# Patient Record
Sex: Female | Born: 1975 | Race: White | Hispanic: No | Smoking: Never smoker
Health system: Southern US, Community
[De-identification: ages and names within clinical notes are randomized; demographics above are authoritative.]

## PROBLEM LIST (undated history)

## (undated) DIAGNOSIS — F419 Anxiety disorder, unspecified: Secondary | ICD-10-CM

## (undated) HISTORY — PX: APPENDECTOMY: SHX54

## (undated) HISTORY — DX: Anxiety disorder, unspecified: F41.9

## (undated) HISTORY — PX: INTRAUTERINE DEVICE (IUD) INSERTION: SHX5877

---

## 2001-04-16 ENCOUNTER — Other Ambulatory Visit: Admission: RE | Admit: 2001-04-16 | Discharge: 2001-04-16 | Payer: Self-pay | Admitting: Obstetrics and Gynecology

## 2005-08-11 ENCOUNTER — Encounter: Admission: RE | Admit: 2005-08-11 | Discharge: 2005-08-11 | Payer: Self-pay | Admitting: Obstetrics & Gynecology

## 2005-10-27 ENCOUNTER — Inpatient Hospital Stay (HOSPITAL_COMMUNITY): Admission: AD | Admit: 2005-10-27 | Discharge: 2005-10-30 | Payer: Self-pay | Admitting: Obstetrics & Gynecology

## 2007-05-10 ENCOUNTER — Inpatient Hospital Stay (HOSPITAL_COMMUNITY): Admission: AD | Admit: 2007-05-10 | Discharge: 2007-05-10 | Payer: Self-pay | Admitting: Obstetrics

## 2007-07-09 ENCOUNTER — Inpatient Hospital Stay (HOSPITAL_COMMUNITY): Admission: AD | Admit: 2007-07-09 | Discharge: 2007-07-10 | Payer: Self-pay | Admitting: Obstetrics and Gynecology

## 2008-12-29 ENCOUNTER — Emergency Department (HOSPITAL_COMMUNITY): Admission: EM | Admit: 2008-12-29 | Discharge: 2008-12-29 | Payer: Self-pay | Admitting: Emergency Medicine

## 2009-10-04 ENCOUNTER — Ambulatory Visit: Payer: Self-pay | Admitting: Internal Medicine

## 2010-03-29 NOTE — Assessment & Plan Note (Signed)
Summary: NEW / [employer] / OK DR PLOT/CD   Vital Signs:  Patient profile:   35 year old female Height:      67 inches Weight:      146 pounds BMI:     22.95 O2 Sat:      98 % on Room air Temp:     98.3 degrees F oral Pulse rate:   90 / minute Pulse rhythm:   regular Resp:     16 per minute BP sitting:   100 / 60  (left arm) Cuff size:   regular  Vitals Entered By: Lanier Prude, CMA(AAMA) (October 04, 2009 9:35 AM)  O2 Flow:  Room air CC: est PCP Is Patient Diabetic? No Comments pt states she is currently [redacted] wks pregnant   CC:  est PCP.  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 [place] Married 2 dtrs 4 and 2; now pregnant Never Smoked Alcohol use-yes Regular exercise-no Smoking Status:  never Does Patient Exercise:  no  Review of Systems  The patient denies anorexia, fever, weight loss, weight gain, vision loss, decreased hearing, hoarseness, chest pain, syncope, dyspnea on exertion, peripheral edema, prolonged cough, headaches, hemoptysis, abdominal pain, melena, hematochezia, severe indigestion/heartburn, hematuria, incontinence, genital sores, muscle weakness, suspicious skin lesions, transient blindness, difficulty walking, depression, unusual weight change, abnormal bleeding, enlarged lymph nodes, angioedema, and breast masses.         Pregnant now  Physical Exam  General:  Well-developed,well-nourished,in no acute distress; alert,appropriate and cooperative throughout  examination Head:  Normocephalic and atraumatic without obvious abnormalities. No apparent alopecia or balding. Eyes:  No corneal or conjunctival inflammation noted. EOMI. Perrla. Ears:  External ear exam shows no significant lesions or deformities.  Otoscopic examination reveals clear canals, tympanic membranes are intact bilaterally without bulging, retraction, inflammation or discharge. Hearing is grossly normal bilaterally. Nose:  External nasal examination shows no deformity or inflammation. Nasal mucosa are pink and moist without lesions or exudates. Mouth:  Oral mucosa and oropharynx without lesions or exudates.  Teeth in good repair. Neck:  No deformities, masses, or tenderness noted. Chest Wall:  No deformities, masses, or tenderness noted. Lungs:  Normal respiratory effort, chest expands symmetrically. Lungs are clear to auscultation, no crackles or wheezes. Heart:  Normal rate and regular rhythm. S1 and S2 normal without gallop, murmur, click, rub or other extra sounds. Abdomen:  Bowel sounds positive,abdomen soft and non-tender without masses, organomegaly or hernias noted. Msk:  No deformity or scoliosis noted of thoracic or lumbar spine.   Pulses:  R and L carotid,radial,femoral,dorsalis pedis and posterior tibial pulses are full and equal bilaterally Neurologic:  No cranial nerve deficits noted. Station and gait are normal. Plantar reflexes are down-going bilaterally. DTRs are symmetrical throughout. Sensory, motor and coordinative functions appear intact. Skin:  Intact without suspicious lesions or rashes Cervical Nodes:  No lymphadenopathy noted Psych:  Cognition and judgment appear intact. Alert and cooperative with normal attention span and concentration. No apparent delusions, illusions, hallucinations   Impression & Recommendations:  Problem # 1:  Preventive Health Care (ICD-V70.0) Assessment New Labs ordered Health and age related issues were discussed. Available  screening tests and vaccinations were discussed as well. Healthy life style including good diet and execise was discussed.  GYN f/u  is pending   Complete Medication List: 1)  Prenatal/folic Acid Tabs (Prenatal vit-fe fumarate-fa)  Patient Instructions: 1)  Please schedule a follow-up appointment in 1 year.

## 2010-05-29 ENCOUNTER — Inpatient Hospital Stay (HOSPITAL_COMMUNITY)
Admission: AD | Admit: 2010-05-29 | Discharge: 2010-05-29 | Discharge status: Against Medical Advice | Payer: BC Managed Care – PPO | Source: Ambulatory Visit | Attending: Obstetrics | Admitting: Obstetrics

## 2010-05-29 ENCOUNTER — Inpatient Hospital Stay (HOSPITAL_COMMUNITY)
Admission: AD | Admit: 2010-05-29 | Discharge: 2010-05-31 | DRG: 373 | Disposition: A | Discharge status: Home / Self Care | Payer: BC Managed Care – PPO | Source: Ambulatory Visit | Attending: Obstetrics | Admitting: Obstetrics

## 2010-05-29 DIAGNOSIS — O9903 Anemia complicating the puerperium: Secondary | ICD-10-CM | POA: Diagnosis not present

## 2010-05-29 DIAGNOSIS — D62 Acute posthemorrhagic anemia: Secondary | ICD-10-CM | POA: Diagnosis not present

## 2010-05-29 DIAGNOSIS — O4290 Premature rupture of membranes, unspecified as to length of time between rupture and onset of labor, unspecified weeks of gestation: Secondary | ICD-10-CM | POA: Insufficient documentation

## 2010-05-29 LAB — CBC
Hemoglobin: 10.5 g/dL — ABNORMAL LOW (ref 12.0–15.0)
MCH: 24.5 pg — ABNORMAL LOW (ref 26.0–34.0)
MCHC: 31.5 g/dL (ref 30.0–36.0)
MCV: 77.8 fL — ABNORMAL LOW (ref 78.0–100.0)
Platelets: 244 10*3/uL (ref 150–400)

## 2010-05-30 LAB — CBC
Hemoglobin: 9.8 g/dL — ABNORMAL LOW (ref 12.0–15.0)
MCH: 24 pg — ABNORMAL LOW (ref 26.0–34.0)
MCHC: 31.2 g/dL (ref 30.0–36.0)
MCV: 77 fL — ABNORMAL LOW (ref 78.0–100.0)
RDW: 15.8 % — ABNORMAL HIGH (ref 11.5–15.5)
WBC: 15.4 10*3/uL — ABNORMAL HIGH (ref 4.0–10.5)

## 2010-05-30 LAB — RPR: RPR Ser Ql: NONREACTIVE

## 2010-11-21 LAB — CBC
MCHC: 34.5
MCV: 81.7
WBC: 6.8

## 2010-11-21 LAB — URINALYSIS, ROUTINE W REFLEX MICROSCOPIC
Hgb urine dipstick: NEGATIVE
Nitrite: NEGATIVE
pH: 5.5

## 2010-11-21 LAB — MAGNESIUM: Magnesium: 1.7

## 2010-11-21 LAB — BASIC METABOLIC PANEL
BUN: 3 — ABNORMAL LOW
Chloride: 106
GFR calc Af Amer: >60

## 2010-11-21 LAB — HEPATIC FUNCTION PANEL
AST: 26
Albumin: 2.2 — ABNORMAL LOW
Alkaline Phosphatase: 65

## 2012-03-20 ENCOUNTER — Encounter: Payer: Self-pay | Admitting: Internal Medicine

## 2012-03-20 ENCOUNTER — Ambulatory Visit (INDEPENDENT_AMBULATORY_CARE_PROVIDER_SITE_OTHER): Payer: BC Managed Care – PPO | Admitting: Internal Medicine

## 2012-03-20 VITALS — BP 118/70 | HR 80 | Temp 98.2°F | Resp 16 | Ht 67.0 in | Wt 141.0 lb

## 2012-03-20 DIAGNOSIS — Z0000 Encounter for general adult medical examination without abnormal findings: Secondary | ICD-10-CM

## 2012-03-20 DIAGNOSIS — Z23 Encounter for immunization: Secondary | ICD-10-CM

## 2012-03-20 MED ORDER — VITAMIN D 1000 UNITS PO TABS: 1000.0000 [IU] | ORAL_TABLET | Freq: Every day | ORAL | Status: AC

## 2012-03-20 NOTE — Assessment & Plan Note (Signed)
We discussed age appropriate health related issues, including available/recomended screening tests and vaccinations. We discussed a need for adhering to healthy diet and exercise. Labs/EKG were reviewed/ordered. All questions were answered.  Tdap GYN/Ophth q 12 mo

## 2012-03-20 NOTE — Progress Notes (Signed)
  Subjective:    Patient ID: Heather Patterson, female    DOB: November 18, 1975, 37 y.o.   MRN: [id number]  HPI  The patient is here for a wellness exam. The patient has been doing well overall without major physical or psychological issues going on lately.  Review of Systems  Constitutional: Negative for chills, activity change, appetite change, fatigue and unexpected weight change.  HENT: Negative for congestion, mouth sores and sinus pressure.   Eyes: Negative for visual disturbance.  Respiratory: Negative for cough and chest tightness.   Gastrointestinal: Negative for nausea and abdominal pain.  Genitourinary: Negative for frequency, difficulty urinating and vaginal pain.  Musculoskeletal: Negative for back pain and gait problem.  Skin: Negative for pallor and rash.  Neurological: Negative for dizziness, tremors, weakness, numbness and headaches.  Psychiatric/Behavioral: Negative for suicidal ideas, confusion and sleep disturbance.       Objective:   Physical Exam  Constitutional: She appears well-developed. No distress.  HENT:  Head: Normocephalic.  Right Ear: External ear normal.  Left Ear: External ear normal.  Nose: Nose normal.  Mouth/Throat: Oropharynx is clear and moist.  Eyes: Conjunctivae normal are normal. Pupils are equal, round, and reactive to light. Right eye exhibits no discharge. Left eye exhibits no discharge.  Neck: Normal range of motion. Neck supple. No JVD present. No tracheal deviation present. No thyromegaly present.  Cardiovascular: Normal rate, regular rhythm and normal heart sounds.   Pulmonary/Chest: No stridor. No respiratory distress. She has no wheezes.  Abdominal: Soft. Bowel sounds are normal. She exhibits no distension and no mass. There is no tenderness. There is no rebound and no guarding.  Musculoskeletal: She exhibits no edema and no tenderness.  Lymphadenopathy:    She has no cervical adenopathy.  Neurological: She displays normal reflexes. No  cranial nerve deficit. She exhibits normal muscle tone. Coordination normal.  Skin: No rash noted. No erythema.  Psychiatric: She has a normal mood and affect. Her behavior is normal. Judgment and thought content normal.          Assessment & Plan:

## 2012-03-29 ENCOUNTER — Other Ambulatory Visit (INDEPENDENT_AMBULATORY_CARE_PROVIDER_SITE_OTHER): Payer: BC Managed Care – PPO

## 2012-03-29 DIAGNOSIS — Z0000 Encounter for general adult medical examination without abnormal findings: Secondary | ICD-10-CM

## 2012-03-29 LAB — LIPID PANEL
Cholesterol: 124 mg/dL (ref 0–200)
HDL: 40 mg/dL (ref >39.00)
LDL Cholesterol: 73 mg/dL (ref 0–99)

## 2012-03-29 LAB — HEPATIC FUNCTION PANEL
ALT: 13 U/L (ref 0–35)
AST: 15 U/L (ref 0–37)
Albumin: 3.9 g/dL (ref 3.5–5.2)
Alkaline Phosphatase: 27 U/L — ABNORMAL LOW (ref 39–117)
Bilirubin, Direct: 0 mg/dL (ref 0.0–0.3)
Total Bilirubin: 0.3 mg/dL (ref 0.3–1.2)
Total Protein: 7.3 g/dL (ref 6.0–8.3)

## 2012-03-29 LAB — BASIC METABOLIC PANEL
CO2: 25 mEq/L (ref 19–32)
Chloride: 107 mEq/L (ref 96–112)
GFR: 75.04 mL/min (ref >60.00)
Glucose, Bld: 101 mg/dL — ABNORMAL HIGH (ref 70–99)
Potassium: 4 mEq/L (ref 3.5–5.1)
Sodium: 138 mEq/L (ref 135–145)

## 2012-03-29 LAB — CBC WITH DIFFERENTIAL/PLATELET
Eosinophils Relative: 2.5 % (ref 0.0–5.0)
Hemoglobin: 9.1 g/dL — ABNORMAL LOW (ref 12.0–15.0)
Lymphocytes Relative: 34.1 % (ref 12.0–46.0)
Lymphs Abs: 2 10*3/uL (ref 0.7–4.0)
MCV: 63.4 fl — ABNORMAL LOW (ref 78.0–100.0)
Monocytes Relative: 8.8 % (ref 3.0–12.0)
Neutro Abs: 3.1 10*3/uL (ref 1.4–7.7)
Platelets: 300 10*3/uL (ref 150.0–400.0)
RBC: 4.59 Mil/uL (ref 3.87–5.11)
WBC: 5.7 10*3/uL (ref 4.5–10.5)

## 2012-03-29 LAB — URINALYSIS
Bilirubin Urine: NEGATIVE
Hgb urine dipstick: NEGATIVE
Leukocytes, UA: NEGATIVE
Total Protein, Urine: NEGATIVE
pH: 6 (ref 5.0–8.0)

## 2012-03-30 ENCOUNTER — Other Ambulatory Visit: Payer: Self-pay | Admitting: Internal Medicine

## 2012-03-30 NOTE — Telephone Encounter (Signed)
Heather Patterson, please, inform patient that all labs are normal except for anemia - likely iron deficiency. Pls start Niferex. Labs in 3 mo Pls sign up yourself and husband for mychart CBC, iron in 3 mo Thx

## 2012-04-01 MED ORDER — IRON POLYSACCH CMPLX-B12-FA 150-0.025-1 MG PO CAPS: 1.0000 | ORAL_CAPSULE | Freq: Every day | ORAL | Status: DC

## 2012-04-01 NOTE — Telephone Encounter (Signed)
Pt informed

## 2012-05-28 ENCOUNTER — Ambulatory Visit (INDEPENDENT_AMBULATORY_CARE_PROVIDER_SITE_OTHER)
Admission: RE | Admit: 2012-05-28 | Discharge: 2012-05-28 | Disposition: A | Discharge status: Home / Self Care | Payer: BC Managed Care – PPO | Source: Ambulatory Visit | Attending: Internal Medicine | Admitting: Internal Medicine

## 2012-05-28 ENCOUNTER — Ambulatory Visit (INDEPENDENT_AMBULATORY_CARE_PROVIDER_SITE_OTHER): Payer: BC Managed Care – PPO | Admitting: Internal Medicine

## 2012-05-28 ENCOUNTER — Encounter: Payer: Self-pay | Admitting: Internal Medicine

## 2012-05-28 ENCOUNTER — Other Ambulatory Visit (INDEPENDENT_AMBULATORY_CARE_PROVIDER_SITE_OTHER): Payer: BC Managed Care – PPO

## 2012-05-28 VITALS — BP 136/90 | HR 108 | Temp 98.8°F | Resp 16 | Wt 137.0 lb

## 2012-05-28 DIAGNOSIS — R0989 Other specified symptoms and signs involving the circulatory and respiratory systems: Secondary | ICD-10-CM

## 2012-05-28 DIAGNOSIS — D649 Anemia, unspecified: Secondary | ICD-10-CM

## 2012-05-28 DIAGNOSIS — R000 Tachycardia, unspecified: Secondary | ICD-10-CM | POA: Insufficient documentation

## 2012-05-28 DIAGNOSIS — R0600 Dyspnea, unspecified: Secondary | ICD-10-CM

## 2012-05-28 DIAGNOSIS — D62 Acute posthemorrhagic anemia: Secondary | ICD-10-CM

## 2012-05-28 DIAGNOSIS — R0609 Other forms of dyspnea: Secondary | ICD-10-CM

## 2012-05-28 LAB — BASIC METABOLIC PANEL
BUN: 12 mg/dL (ref 6–23)
CO2: 26 mEq/L (ref 19–32)
Calcium: 8.9 mg/dL (ref 8.4–10.5)
Creatinine, Ser: 0.8 mg/dL (ref 0.4–1.2)
Glucose, Bld: 94 mg/dL (ref 70–99)
Sodium: 137 mEq/L (ref 135–145)

## 2012-05-28 LAB — CBC WITH DIFFERENTIAL/PLATELET
Basophils Absolute: 0 10*3/uL (ref 0.0–0.1)
Basophils Relative: 0.6 % (ref 0.0–3.0)
Eosinophils Absolute: 0 10*3/uL (ref 0.0–0.7)
Lymphocytes Relative: 25.2 % (ref 12.0–46.0)
Lymphs Abs: 1.6 10*3/uL (ref 0.7–4.0)
MCV: 63 fl — ABNORMAL LOW (ref 78.0–100.0)
Monocytes Relative: 11.1 % (ref 3.0–12.0)

## 2012-05-28 LAB — TSH: TSH: 3.63 u[IU]/mL (ref 0.35–5.50)

## 2012-05-28 LAB — IBC PANEL: Iron: 9 ug/dL — ABNORMAL LOW (ref 42–145)

## 2012-05-28 MED ORDER — ATENOLOL 25 MG PO TABS: 25.0000 mg | ORAL_TABLET | Freq: Every day | ORAL | Status: DC

## 2012-05-28 NOTE — Progress Notes (Signed)
  Subjective:     Shortness of Breath This is a new problem. The current episode started in the past 7 days. The problem occurs every few minutes. The problem has been waxing and waning. Pertinent negatives include no abdominal pain, chest pain, headaches or rash. The symptoms are aggravated by exercise. The patient has no known risk factors for DVT/PE. There is no history of asthma.   Wt Readings from Last 3 Encounters:  05/28/12 137 lb (62.143 kg)  03/20/12 141 lb (63.957 kg)  10/04/09 146 lb (66.225 kg)   BP Readings from Last 3 Encounters:  05/28/12 140/82  03/20/12 118/70  10/04/09 100/60    BP 140/82  Pulse 108  Temp(Src) 98.8 F (37.1 C) (Oral)  Resp 16  Wt 137 lb (62.143 kg)  BMI 21.45 kg/m2  SpO2 98%    Review of Systems  Constitutional: Negative for chills, activity change, appetite change, fatigue and unexpected weight change.  HENT: Negative for congestion, mouth sores and sinus pressure.   Eyes: Negative for visual disturbance.  Respiratory: Positive for shortness of breath. Negative for cough and chest tightness.   Cardiovascular: Positive for palpitations. Negative for chest pain.       HR 105-120  Gastrointestinal: Negative for nausea and abdominal pain.  Genitourinary: Negative for frequency, difficulty urinating and vaginal pain.  Musculoskeletal: Negative for back pain and gait problem.  Skin: Negative for pallor and rash.  Neurological: Negative for dizziness, tremors, weakness, numbness and headaches.  Psychiatric/Behavioral: Negative for suicidal ideas, confusion and sleep disturbance.       Objective:   Physical Exam  Constitutional: She appears well-developed. No distress.  HENT:  Head: Normocephalic.  Right Ear: External ear normal.  Left Ear: External ear normal.  Nose: Nose normal.  Mouth/Throat: Oropharynx is clear and moist.  Eyes: Conjunctivae are normal. Pupils are equal, round, and reactive to light. Right eye exhibits no discharge.  Left eye exhibits no discharge.  Neck: Normal range of motion. Neck supple. No JVD present. No tracheal deviation present. No thyromegaly present.  Cardiovascular: Normal rate, regular rhythm and normal heart sounds.   Pulmonary/Chest: No stridor. No respiratory distress. She has no wheezes.  Abdominal: Soft. Bowel sounds are normal. She exhibits no distension and no mass. There is no tenderness. There is no rebound and no guarding.  Musculoskeletal: She exhibits no edema and no tenderness.  Lymphadenopathy:    She has no cervical adenopathy.  Neurological: She displays normal reflexes. No cranial nerve deficit. She exhibits normal muscle tone. Coordination normal.  Skin: No rash noted. No erythema.  Psychiatric: She has a normal mood and affect. Her behavior is normal. Judgment and thought content normal.   Lab Results  Component Value Date   WBC 5.7 03/29/2012   HGB 9.1* 03/29/2012   HCT 29.1* 03/29/2012   PLT 300.0 03/29/2012   GLUCOSE 101* 03/29/2012   CHOL 124 03/29/2012   TRIG 55.0 03/29/2012   HDL 40.00 03/29/2012   LDLCALC 73 03/29/2012   ALT 13 03/29/2012   AST 15 03/29/2012   NA 138 03/29/2012   K 4.0 03/29/2012   CL 107 03/29/2012   CREATININE 0.9 03/29/2012   BUN 15 03/29/2012   CO2 25 03/29/2012   TSH 3.78 03/29/2012          Assessment & Plan:

## 2012-05-28 NOTE — Patient Instructions (Signed)
No caffeine.

## 2012-05-28 NOTE — Assessment & Plan Note (Addendum)
Labs EKG CXR

## 2012-05-28 NOTE — Assessment & Plan Note (Addendum)
4/14 new S tachy/SVT  EKG Labs D/c caffeine Atenolol to start if needed Card consult was discussed

## 2012-05-28 NOTE — Assessment & Plan Note (Signed)
Labs

## 2012-05-29 ENCOUNTER — Encounter: Payer: Self-pay | Admitting: Internal Medicine

## 2012-11-06 ENCOUNTER — Telehealth: Payer: Self-pay | Admitting: *Deleted

## 2012-11-06 DIAGNOSIS — D649 Anemia, unspecified: Secondary | ICD-10-CM

## 2012-11-06 NOTE — Telephone Encounter (Signed)
Left message on VM stating labs have been ordered.

## 2012-11-06 NOTE — Telephone Encounter (Signed)
Ok CBC, iron/TIBC  Dx anemia Thx

## 2012-11-06 NOTE — Telephone Encounter (Signed)
Pt called states she needs a refill on her Iron medication but requests whether she needs her iron levels checked prior to refill.  please advise

## 2012-11-12 ENCOUNTER — Other Ambulatory Visit (INDEPENDENT_AMBULATORY_CARE_PROVIDER_SITE_OTHER): Payer: BC Managed Care – PPO

## 2012-11-12 ENCOUNTER — Other Ambulatory Visit: Payer: Self-pay | Admitting: Internal Medicine

## 2012-11-12 DIAGNOSIS — D649 Anemia, unspecified: Secondary | ICD-10-CM

## 2012-11-12 LAB — CBC WITH DIFFERENTIAL/PLATELET
Basophils Absolute: 0 10*3/uL (ref 0.0–0.1)
Basophils Relative: 0.6 % (ref 0.0–3.0)
Eosinophils Absolute: 0.1 10*3/uL (ref 0.0–0.7)
Hemoglobin: 10.4 g/dL — ABNORMAL LOW (ref 12.0–15.0)
Lymphocytes Relative: 28.6 % (ref 12.0–46.0)
MCHC: 32.6 g/dL (ref 30.0–36.0)
MCV: 65.4 fl — ABNORMAL LOW (ref 78.0–100.0)
Monocytes Relative: 8.8 % (ref 3.0–12.0)
Platelets: 254 10*3/uL (ref 150.0–400.0)
WBC: 7.8 10*3/uL (ref 4.5–10.5)

## 2012-11-12 MED ORDER — IRON POLYSACCH CMPLX-B12-FA 150-0.025-1 MG PO CAPS: 1.0000 | ORAL_CAPSULE | Freq: Every day | ORAL | Status: DC

## 2013-01-02 ENCOUNTER — Other Ambulatory Visit: Payer: Self-pay

## 2013-03-27 ENCOUNTER — Ambulatory Visit (INDEPENDENT_AMBULATORY_CARE_PROVIDER_SITE_OTHER): Payer: BC Managed Care – PPO | Admitting: Family Medicine

## 2013-03-27 ENCOUNTER — Encounter: Payer: Self-pay | Admitting: *Deleted

## 2013-03-27 ENCOUNTER — Encounter: Payer: Self-pay | Admitting: Family Medicine

## 2013-03-27 VITALS — BP 114/68 | HR 86 | Temp 98.3°F | Resp 16 | Wt 142.0 lb

## 2013-03-27 DIAGNOSIS — M533 Sacrococcygeal disorders, not elsewhere classified: Secondary | ICD-10-CM

## 2013-03-27 MED ORDER — TRAMADOL HCL 50 MG PO TABS: 50.0000 mg | ORAL_TABLET | Freq: Three times a day (TID) | ORAL | Status: DC | PRN

## 2013-03-27 NOTE — Patient Instructions (Addendum)
Good to meet you Tylenol 650 mg 3 times a day Tramadol 50 mg up to 3 times a day but especially at night.  Ice 20 minutes 2 times daily Consider donut pillow.  Come back in 2 weeks if not better.   Tailbone Injury The tailbone (coccyx) is the small bone at the lower end of the spine. A tailbone injury may involve stretched ligaments, bruising, or a broken bone (fracture). Women are more vulnerable to this injury due to having a wider pelvis. CAUSES  This type of injury typically occurs from falling and landing on the tailbone. Repeated strain or friction from actions such as rowing and bicycling may also injure the area. The tailbone can be injured during childbirth. Infections or tumors may also press on the tailbone and cause pain. Sometimes, the cause of injury is unknown. SYMPTOMS   Bruising.  Pain when sitting.  Painful bowel movements.  In women, pain during intercourse. DIAGNOSIS  Your caregiver can diagnose a tailbone injury based on your symptoms and a physical exam. X-rays may be taken if a fracture is suspected. Your caregiver may also use an MRI scan imaging test to evaluate your symptoms. TREATMENT  Your caregiver may prescribe medicines to help relieve your pain. Most tailbone injuries heal on their own in 4 to 6 weeks. However, if the injury is caused by an infection or tumor, the recovery period may vary. PREVENTION  Wear appropriate padding and sports gear when bicycling and rowing. This can help prevent an injury from repeated strain or friction. HOME CARE INSTRUCTIONS   Put ice on the injured area.  Put ice in a plastic bag.  Place a towel between your skin and the bag.  Leave the ice on for 15-20 minutes, every hour while awake for the first 1 to 2 days.  Sit on a large, rubber or inflated ring or cushion to ease your pain. Lean forward when sitting to help decrease discomfort.  Avoid sitting for long periods of time.  Increase your activity as the pain  allows.  Only take over-the-counter or prescription medicines for pain, discomfort, or fever as directed by your caregiver.  You may use stool softeners if it is painful to have a bowel movement, or as directed by your caregiver.  Eat a diet with plenty of fiber to help prevent constipation.  Keep all follow-up appointments as directed by your caregiver. SEEK MEDICAL CARE IF:   Your pain becomes worse.  Your bowel movements cause a great deal of discomfort.  You are unable to have a bowel movement.  You have a fever. MAKE SURE YOU:  Understand these instructions.  Will watch your condition.  Will get help right away if you are not doing well or get worse. Document Released: 02/11/2000 Document Revised: 05/08/2011 Document Reviewed: 09/08/2010 [facility] LLPExitCare Patient Information 2014 [place], [place].

## 2013-03-27 NOTE — Progress Notes (Signed)
   CC: Fell yesterday having buttock pain  HPI: Patient is a very pleasant 38 year old female who fell on the stairs yesterday directly onto her buttocks. Patient is having pain right over the coccyx. 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 Patient states that this is very sore all the time and is severely tender when having any direct pressure. Patient denies any swelling or any discoloration that she can note. Patient denies any radiation of the legs or any numbness. Patient denies any bowel or bladder incontinence. Patient would just like to be seem to make sure that everything is okay. Patient is able to ambulate without any significant discomfort.   Past medical, surgical, family and social history reviewed. Medications reviewed all in the electronic medical record.   Review of Systems: No headache, visual changes, nausea, vomiting, diarrhea, constipation, dizziness, abdominal pain, skin rash, fevers, chills, night sweats, weight loss, swollen lymph nodes, body aches, joint swelling, muscle aches, chest pain, shortness of breath, mood changes.   Objective:    Blood pressure 114/68, pulse 86, temperature 98.3 F (36.8 C), temperature source Oral, resp. rate 16, weight 142 lb (64.411 kg), SpO2 98.00%.   General: No apparent distress alert and oriented x3 mood and affect normal, dressed appropriately.  HEENT: Pupils equal, extraocular movements intact Respiratory: Patient's speak in full sentences and does not appear short of breath Cardiovascular: No lower extremity edema, non tender, no erythema Skin: Warm dry intact with no signs of infection or rash on extremities or on axial skeleton. Abdomen: Soft nontender Neuro: Cranial nerves II through XII are intact, neurovascularly intact in all extremities with 2+ DTRs and 2+ pulses. Lymph: No lymphadenopathy of posterior or anterior cervical chain or axillae bilaterally.  Gait normal with good balance and coordination.  MSK: Non  tender with full range of motion and good stability and symmetric strength and tone of shoulders, elbows, wrist, hip, knee and ankles bilaterally.  Back Exam:  Inspection: Unremarkable  Motion: Flexion 45 deg, Extension 45 deg, Side Bending to 45 deg bilaterally,  Rotation to 45 deg bilaterally  SLR laying: Negative  XSLR laying: Negative  Palpable tenderness: Severely tender over the coccyx with some trace effusion noted. FABER: negative. Sensory change: Gross sensation intact to all lumbar and sacral dermatomes.  Reflexes: 2+ at both patellar tendons, 2+ at achilles tendons, Babinski's downgoing.  Strength at foot  Plantar-flexion: 5/5 Dorsi-flexion: 5/5 Eversion: 5/5 Inversion: 5/5  Leg strength  Quad: 5/5 Hamstring: 5/5 Hip flexor: 5/5 Hip abductors: 5/5  Gait unremarkable.   Impression and Recommendations:     This case required medical decision making of moderate complexity.

## 2013-03-27 NOTE — Assessment & Plan Note (Signed)
Patient is having tailbone pain could potentially have a fracture. No imaging was done to did not change management today. Patient will try tramadol and Tylenol for pain relief as well as icing protocol. Discuss the possibility of a donut that could be helpful for sitting. Patient given a note for work for the next 24 hours. Patient will come back in one week if not better and warned of potential red flags.

## 2013-03-27 NOTE — Progress Notes (Signed)
Pre-visit discussion using our clinic review tool. No additional management support is needed unless otherwise documented below in the visit note.  

## 2013-12-08 ENCOUNTER — Encounter: Payer: Self-pay | Admitting: Internal Medicine

## 2013-12-11 ENCOUNTER — Other Ambulatory Visit: Payer: Self-pay | Admitting: Internal Medicine

## 2013-12-11 DIAGNOSIS — D509 Iron deficiency anemia, unspecified: Secondary | ICD-10-CM

## 2013-12-18 ENCOUNTER — Other Ambulatory Visit (INDEPENDENT_AMBULATORY_CARE_PROVIDER_SITE_OTHER): Payer: BC Managed Care – PPO

## 2013-12-18 DIAGNOSIS — D509 Iron deficiency anemia, unspecified: Secondary | ICD-10-CM

## 2013-12-18 LAB — CBC WITH DIFFERENTIAL/PLATELET
BASOS PCT: 0.7 % (ref 0.0–3.0)
Basophils Absolute: 0 10*3/uL (ref 0.0–0.1)
EOS ABS: 0.1 10*3/uL (ref 0.0–0.7)
Eosinophils Relative: 1.5 % (ref 0.0–5.0)
HCT: 29.2 % — ABNORMAL LOW (ref 36.0–46.0)
HEMOGLOBIN: 9 g/dL — AB (ref 12.0–15.0)
Lymphocytes Relative: 31.7 % (ref 12.0–46.0)
Lymphs Abs: 2 10*3/uL (ref 0.7–4.0)
MCHC: 30.7 g/dL (ref 30.0–36.0)
MCV: 62.1 fl — ABNORMAL LOW (ref 78.0–100.0)
MONO ABS: 0.6 10*3/uL (ref 0.1–1.0)
Monocytes Relative: 8.6 % (ref 3.0–12.0)
NEUTROS ABS: 3.7 10*3/uL (ref 1.4–7.7)
NEUTROS PCT: 57.5 % (ref 43.0–77.0)
Platelets: 348 10*3/uL (ref 150.0–400.0)
RBC: 4.7 Mil/uL (ref 3.87–5.11)
RDW: 20 % — ABNORMAL HIGH (ref 11.5–15.5)
WBC: 6.4 10*3/uL (ref 4.0–10.5)

## 2013-12-18 LAB — IBC PANEL
Iron: 11 ug/dL — ABNORMAL LOW (ref 42–145)
Saturation Ratios: 1.9 % — ABNORMAL LOW (ref 20.0–50.0)
Transferrin: 403.4 mg/dL — ABNORMAL HIGH (ref 212.0–360.0)

## 2014-04-10 ENCOUNTER — Other Ambulatory Visit (INDEPENDENT_AMBULATORY_CARE_PROVIDER_SITE_OTHER): Payer: BC Managed Care – PPO

## 2014-04-10 ENCOUNTER — Encounter: Payer: Self-pay | Admitting: Internal Medicine

## 2014-04-10 ENCOUNTER — Ambulatory Visit (INDEPENDENT_AMBULATORY_CARE_PROVIDER_SITE_OTHER): Payer: BC Managed Care – PPO | Admitting: Internal Medicine

## 2014-04-10 VITALS — BP 120/80 | HR 63 | Temp 98.0°F | Ht 68.0 in | Wt 151.0 lb

## 2014-04-10 DIAGNOSIS — D500 Iron deficiency anemia secondary to blood loss (chronic): Secondary | ICD-10-CM

## 2014-04-10 DIAGNOSIS — Z0000 Encounter for general adult medical examination without abnormal findings: Secondary | ICD-10-CM

## 2014-04-10 LAB — URINALYSIS
Bilirubin Urine: NEGATIVE
Hgb urine dipstick: NEGATIVE
Ketones, ur: NEGATIVE
Leukocytes, UA: NEGATIVE
NITRITE: NEGATIVE
PH: 7 (ref 5.0–8.0)
SPECIFIC GRAVITY, URINE: 1.01 (ref 1.000–1.030)
TOTAL PROTEIN, URINE-UPE24: NEGATIVE
Urine Glucose: NEGATIVE
Urobilinogen, UA: 0.2 (ref 0.0–1.0)

## 2014-04-10 LAB — VITAMIN B12: Vitamin B-12: 347 pg/mL (ref 211–911)

## 2014-04-10 LAB — BASIC METABOLIC PANEL
BUN: 10 mg/dL (ref 6–23)
CHLORIDE: 104 meq/L (ref 96–112)
CO2: 27 mEq/L (ref 19–32)
CREATININE: 0.75 mg/dL (ref 0.40–1.20)
Calcium: 9.6 mg/dL (ref 8.4–10.5)
GFR: 91.6 mL/min (ref >60.00)
Glucose, Bld: 99 mg/dL (ref 70–99)
Potassium: 4 mEq/L (ref 3.5–5.1)
Sodium: 138 mEq/L (ref 135–145)

## 2014-04-10 LAB — CBC WITH DIFFERENTIAL/PLATELET
BASOS PCT: 0.5 % (ref 0.0–3.0)
Basophils Absolute: 0 10*3/uL (ref 0.0–0.1)
EOS PCT: 1.7 % (ref 0.0–5.0)
Eosinophils Absolute: 0.1 10*3/uL (ref 0.0–0.7)
HCT: 33.8 % — ABNORMAL LOW (ref 36.0–46.0)
Hemoglobin: 10.8 g/dL — ABNORMAL LOW (ref 12.0–15.0)
LYMPHS PCT: 31.1 % (ref 12.0–46.0)
Lymphs Abs: 2 10*3/uL (ref 0.7–4.0)
MCHC: 31.8 g/dL (ref 30.0–36.0)
MCV: 64.6 fl — ABNORMAL LOW (ref 78.0–100.0)
MONOS PCT: 7.3 % (ref 3.0–12.0)
Monocytes Absolute: 0.5 10*3/uL (ref 0.1–1.0)
NEUTROS PCT: 59.4 % (ref 43.0–77.0)
Neutro Abs: 3.8 10*3/uL (ref 1.4–7.7)
Platelets: 364 10*3/uL (ref 150.0–400.0)
RBC: 5.24 Mil/uL — AB (ref 3.87–5.11)
RDW: 21.5 % — ABNORMAL HIGH (ref 11.5–15.5)
WBC: 6.4 10*3/uL (ref 4.0–10.5)

## 2014-04-10 LAB — IBC PANEL
Iron: 18 ug/dL — ABNORMAL LOW (ref 42–145)
Saturation Ratios: 3 % — ABNORMAL LOW (ref 20.0–50.0)
Transferrin: 428 mg/dL — ABNORMAL HIGH (ref 212.0–360.0)

## 2014-04-10 LAB — HEPATIC FUNCTION PANEL
ALT: 14 U/L (ref 0–35)
AST: 16 U/L (ref 0–37)
Albumin: 4.4 g/dL (ref 3.5–5.2)
Alkaline Phosphatase: 38 U/L — ABNORMAL LOW (ref 39–117)
BILIRUBIN DIRECT: 0.1 mg/dL (ref 0.0–0.3)
TOTAL PROTEIN: 8.1 g/dL (ref 6.0–8.3)
Total Bilirubin: 0.4 mg/dL (ref 0.2–1.2)

## 2014-04-10 LAB — VITAMIN D 25 HYDROXY (VIT D DEFICIENCY, FRACTURES): VITD: 28.98 ng/mL — ABNORMAL LOW (ref 30.00–100.00)

## 2014-04-10 LAB — TSH: TSH: 4.56 u[IU]/mL — ABNORMAL HIGH (ref 0.35–4.50)

## 2014-04-10 MED ORDER — VITAMIN D 1000 UNITS PO TABS: 1000.0000 [IU] | ORAL_TABLET | Freq: Every day | ORAL | Status: AC

## 2014-04-10 MED ORDER — IRON POLYSACCH CMPLX-B12-FA 150-0.025-1 MG PO CAPS: 1.0000 | ORAL_CAPSULE | Freq: Every day | ORAL | Status: DC

## 2014-04-10 NOTE — Assessment & Plan Note (Signed)
We discussed age appropriate health related issues, including available/recomended screening tests and vaccinations. We discussed a need for adhering to healthy diet and exercise. Labs/EKG were reviewed/ordered. All questions were answered.  Labs 

## 2014-04-10 NOTE — Assessment & Plan Note (Signed)
Labs Iron 

## 2014-04-10 NOTE — Progress Notes (Signed)
  Subjective:   The patient is here for a wellness exam.   The patient needs to address  Chronic anemia    HPI Wt Readings from Last 3 Encounters:  04/10/14 151 lb (68.493 kg)  03/27/13 142 lb (64.411 kg)  05/28/12 137 lb (62.143 kg)   BP Readings from Last 3 Encounters:  04/10/14 120/80  03/27/13 114/68  05/28/12 136/90    BP 120/80 mmHg  Pulse 63  Temp(Src) 98 F (36.7 C) (Oral)  Ht 5\' 8"  (1.727 m)  Wt 151 lb (68.493 kg)  BMI 22.96 kg/m2  SpO2 95%    Review of Systems  Constitutional: Negative for chills, activity change, appetite change, fatigue and unexpected weight change.  HENT: Negative for congestion, mouth sores and sinus pressure.   Eyes: Negative for visual disturbance.  Respiratory: Negative for cough and chest tightness.   Cardiovascular: Positive for palpitations.       HR 105-120  Gastrointestinal: Negative for nausea.  Genitourinary: Negative for frequency, difficulty urinating and vaginal pain.  Musculoskeletal: Negative for back pain and gait problem.  Skin: Negative for pallor.  Neurological: Negative for dizziness, tremors, weakness and numbness.  Psychiatric/Behavioral: Negative for suicidal ideas, confusion and sleep disturbance.       Objective:   Physical Exam  Constitutional: She appears well-developed. No distress.  HENT:  Head: Normocephalic.  Right Ear: External ear normal.  Left Ear: External ear normal.  Nose: Nose normal.  Mouth/Throat: Oropharynx is clear and moist.  Eyes: Conjunctivae are normal. Pupils are equal, round, and reactive to light. Right eye exhibits no discharge. Left eye exhibits no discharge.  Neck: Normal range of motion. Neck supple. No JVD present. No tracheal deviation present. No thyromegaly present.  Cardiovascular: Normal rate, regular rhythm and normal heart sounds.   Pulmonary/Chest: No stridor. No respiratory distress. She has no wheezes.  Abdominal: Soft. Bowel sounds are normal. She exhibits no  distension and no mass. There is no tenderness. There is no rebound and no guarding.  Musculoskeletal: She exhibits no edema or tenderness.  Lymphadenopathy:    She has no cervical adenopathy.  Neurological: She displays normal reflexes. No cranial nerve deficit. She exhibits normal muscle tone. Coordination normal.  Skin: No rash noted. No erythema.  Psychiatric: She has a normal mood and affect. Her behavior is normal. Judgment and thought content normal.   Lab Results  Component Value Date   WBC 6.4 12/18/2013   HGB 9.0* 12/18/2013   HCT 29.2* 12/18/2013   PLT 348.0 12/18/2013   GLUCOSE 94 05/28/2012   CHOL 124 03/29/2012   TRIG 55.0 03/29/2012   HDL 40.00 03/29/2012   LDLCALC 73 03/29/2012   ALT 13 03/29/2012   AST 15 03/29/2012   NA 137 05/28/2012   K 3.6 05/28/2012   CL 105 05/28/2012   CREATININE 0.8 05/28/2012   BUN 12 05/28/2012   CO2 26 05/28/2012   TSH 3.63 05/28/2012          Assessment & Plan:

## 2014-04-10 NOTE — Progress Notes (Signed)
Pre visit review using our clinic review tool, if applicable. No additional management support is needed unless otherwise documented below in the visit note. 

## 2014-04-11 ENCOUNTER — Encounter: Payer: Self-pay | Admitting: Internal Medicine

## 2014-04-11 ENCOUNTER — Other Ambulatory Visit: Payer: Self-pay | Admitting: Internal Medicine

## 2014-04-11 DIAGNOSIS — R7989 Other specified abnormal findings of blood chemistry: Secondary | ICD-10-CM

## 2014-04-11 DIAGNOSIS — D500 Iron deficiency anemia secondary to blood loss (chronic): Secondary | ICD-10-CM

## 2014-04-11 MED ORDER — ERGOCALCIFEROL 1.25 MG (50000 UT) PO CAPS: 50000.0000 [IU] | ORAL_CAPSULE | ORAL | Status: DC

## 2014-06-22 ENCOUNTER — Ambulatory Visit (INDEPENDENT_AMBULATORY_CARE_PROVIDER_SITE_OTHER): Payer: BC Managed Care – PPO | Admitting: Internal Medicine

## 2014-06-22 ENCOUNTER — Encounter: Payer: Self-pay | Admitting: Internal Medicine

## 2014-06-22 VITALS — BP 124/82 | HR 73 | Temp 98.4°F | Resp 16 | Wt 151.0 lb

## 2014-06-22 DIAGNOSIS — H60392 Other infective otitis externa, left ear: Secondary | ICD-10-CM | POA: Diagnosis not present

## 2014-06-22 DIAGNOSIS — H60399 Other infective otitis externa, unspecified ear: Secondary | ICD-10-CM | POA: Insufficient documentation

## 2014-06-22 DIAGNOSIS — H6123 Impacted cerumen, bilateral: Secondary | ICD-10-CM | POA: Diagnosis not present

## 2014-06-22 DIAGNOSIS — H6120 Impacted cerumen, unspecified ear: Secondary | ICD-10-CM | POA: Insufficient documentation

## 2014-06-22 MED ORDER — NEOMYCIN-POLYMYXIN-HC 3.5-10000-1 OT SOLN: 3.0000 [drp] | Freq: Four times a day (QID) | OTIC | Status: DC

## 2014-06-22 NOTE — Progress Notes (Signed)
Subjective:   C/o L earache x 4 d, fullness  The patient needs to address  Chronic anemia    HPI Wt Readings from Last 3 Encounters:  06/22/14 151 lb (68.493 kg)  04/10/14 151 lb (68.493 kg)  03/27/13 142 lb (64.411 kg)   BP Readings from Last 3 Encounters:  06/22/14 124/82  04/10/14 120/80  03/27/13 114/68    BP 124/82 mmHg  Pulse 73  Temp(Src) 98.4 F (36.9 C) (Oral)  Resp 16  Wt 151 lb (68.493 kg)  SpO2 98%    Review of Systems  Constitutional: Negative for chills, activity change, appetite change, fatigue and unexpected weight change.  HENT: Negative for congestion, mouth sores and sinus pressure.   Eyes: Negative for visual disturbance.  Respiratory: Negative for cough and chest tightness.   Cardiovascular: Positive for palpitations.       HR 105-120  Gastrointestinal: Negative for nausea.  Genitourinary: Negative for frequency, difficulty urinating and vaginal pain.  Musculoskeletal: Negative for back pain and gait problem.  Skin: Negative for pallor.  Neurological: Negative for dizziness, tremors, weakness and numbness.  Psychiatric/Behavioral: Negative for suicidal ideas, confusion and sleep disturbance.       Objective:   Physical Exam  Constitutional: She appears well-developed. No distress.  HENT:  Head: Normocephalic.  Right Ear: External ear normal.  Left Ear: External ear normal.  Nose: Nose normal.  Mouth/Throat: Oropharynx is clear and moist.  Eyes: Conjunctivae are normal. Pupils are equal, round, and reactive to light. Right eye exhibits no discharge. Left eye exhibits no discharge.  Neck: Normal range of motion. Neck supple. No JVD present. No tracheal deviation present. No thyromegaly present.  Cardiovascular: Normal rate, regular rhythm and normal heart sounds.   Pulmonary/Chest: No stridor. No respiratory distress. She has no wheezes.  Abdominal: Soft. Bowel sounds are normal. She exhibits no distension and no mass. There is no  tenderness. There is no rebound and no guarding.  Musculoskeletal: She exhibits no edema or tenderness.  Lymphadenopathy:    She has no cervical adenopathy.  Neurological: She displays normal reflexes. No cranial nerve deficit. She exhibits normal muscle tone. Coordination normal.  Skin: No rash noted. No erythema.  Psychiatric: She has a normal mood and affect. Her behavior is normal. Judgment and thought content normal.  B earwax L ear w/otitis externa changes  Lab Results  Component Value Date   WBC 6.4 04/10/2014   HGB 10.8* 04/10/2014   HCT 33.8* 04/10/2014   PLT 364.0 04/10/2014   GLUCOSE 99 04/10/2014   CHOL 124 03/29/2012   TRIG 55.0 03/29/2012   HDL 40.00 03/29/2012   LDLCALC 73 03/29/2012   ALT 14 04/10/2014   AST 16 04/10/2014   NA 138 04/10/2014   K 4.0 04/10/2014   CL 104 04/10/2014   CREATININE 0.75 04/10/2014   BUN 10 04/10/2014   CO2 27 04/10/2014   TSH 4.56* 04/10/2014      Procedure Note :     Procedure :  Ear irrigation   Indication:  Cerumen impaction B   Risks, including pain, dizziness, eardrum perforation, bleeding, infection and others as well as benefits were explained to the patient in detail. Verbal consent was obtained and the patient agreed to proceed.    We used "The Elephant Ear Irrigation Device" filled with lukewarm water for irrigation. A large amount wax was recovered. Procedure has also required manual wax removal with an ear loop.   Tolerated well. Complications: None.   Postprocedure instructions :  Call if problems.       Assessment & Plan:

## 2014-06-22 NOTE — Progress Notes (Signed)
Pre visit review using our clinic review tool, if applicable. No additional management support is needed unless otherwise documented below in the visit note. 

## 2014-09-10 ENCOUNTER — Telehealth: Payer: Self-pay | Admitting: Internal Medicine

## 2014-09-10 NOTE — Telephone Encounter (Signed)
Left voice mail for patient regarding no current availabilities for Dr. Katrinka BlazingSmith or Dr. Posey ReaPlotnikov this week and to consider going to Urgent Care for evaluation.

## 2014-09-10 NOTE — Telephone Encounter (Signed)
Patient feels like she broke her big toe. She was asking to see dr Katrinka Blazingsmith, but i advised no access for several weeks. She asked to speak with you to possibly get some direction.

## 2015-02-28 HISTORY — PX: BREAST BIOPSY: SHX20

## 2015-04-03 ENCOUNTER — Encounter: Payer: Self-pay | Admitting: Internal Medicine

## 2015-04-13 ENCOUNTER — Other Ambulatory Visit: Payer: Self-pay | Admitting: *Deleted

## 2015-04-13 ENCOUNTER — Other Ambulatory Visit (INDEPENDENT_AMBULATORY_CARE_PROVIDER_SITE_OTHER): Payer: BC Managed Care – PPO

## 2015-04-13 DIAGNOSIS — D500 Iron deficiency anemia secondary to blood loss (chronic): Secondary | ICD-10-CM | POA: Diagnosis not present

## 2015-04-13 DIAGNOSIS — Z0000 Encounter for general adult medical examination without abnormal findings: Secondary | ICD-10-CM | POA: Diagnosis not present

## 2015-04-13 LAB — CBC WITH DIFFERENTIAL/PLATELET
BASOS ABS: 0 10*3/uL (ref 0.0–0.1)
Basophils Relative: 0.6 % (ref 0.0–3.0)
EOS ABS: 0.1 10*3/uL (ref 0.0–0.7)
Eosinophils Relative: 1.8 % (ref 0.0–5.0)
HCT: 39 % (ref 36.0–46.0)
HEMOGLOBIN: 13 g/dL (ref 12.0–15.0)
Lymphocytes Relative: 24.9 % (ref 12.0–46.0)
Lymphs Abs: 1.7 10*3/uL (ref 0.7–4.0)
MCHC: 33.3 g/dL (ref 30.0–36.0)
MCV: 80.2 fl (ref 78.0–100.0)
MONO ABS: 0.6 10*3/uL (ref 0.1–1.0)
Monocytes Relative: 8.6 % (ref 3.0–12.0)
NEUTROS PCT: 64.1 % (ref 43.0–77.0)
Neutro Abs: 4.4 10*3/uL (ref 1.4–7.7)
Platelets: 273 10*3/uL (ref 150.0–400.0)
RBC: 4.86 Mil/uL (ref 3.87–5.11)
RDW: 15.6 % — ABNORMAL HIGH (ref 11.5–15.5)
WBC: 6.9 10*3/uL (ref 4.0–10.5)

## 2015-04-13 LAB — TSH: TSH: 3.28 u[IU]/mL (ref 0.35–4.50)

## 2015-04-13 LAB — T4, FREE: FREE T4: 0.92 ng/dL (ref 0.60–1.60)

## 2015-04-13 LAB — IBC PANEL
IRON: 108 ug/dL (ref 42–145)
Saturation Ratios: 23.4 % (ref 20.0–50.0)
TRANSFERRIN: 330 mg/dL (ref 212.0–360.0)

## 2015-10-12 ENCOUNTER — Encounter: Payer: Self-pay | Admitting: Internal Medicine

## 2015-10-25 ENCOUNTER — Encounter: Payer: Self-pay | Admitting: Obstetrics & Gynecology

## 2015-11-03 ENCOUNTER — Encounter: Payer: Self-pay | Admitting: Internal Medicine

## 2015-12-16 ENCOUNTER — Ambulatory Visit: Payer: BC Managed Care – PPO | Admitting: Internal Medicine

## 2016-02-29 ENCOUNTER — Ambulatory Visit: Payer: Self-pay | Admitting: Internal Medicine

## 2016-03-09 ENCOUNTER — Encounter: Payer: Self-pay | Admitting: Internal Medicine

## 2016-03-09 ENCOUNTER — Ambulatory Visit (INDEPENDENT_AMBULATORY_CARE_PROVIDER_SITE_OTHER): Payer: BC Managed Care – PPO | Admitting: Internal Medicine

## 2016-03-09 ENCOUNTER — Other Ambulatory Visit (INDEPENDENT_AMBULATORY_CARE_PROVIDER_SITE_OTHER): Payer: BC Managed Care – PPO

## 2016-03-09 VITALS — BP 120/72 | HR 97 | Ht 68.0 in | Wt 141.0 lb

## 2016-03-09 DIAGNOSIS — Z0000 Encounter for general adult medical examination without abnormal findings: Secondary | ICD-10-CM

## 2016-03-09 DIAGNOSIS — D500 Iron deficiency anemia secondary to blood loss (chronic): Secondary | ICD-10-CM | POA: Diagnosis not present

## 2016-03-09 LAB — BASIC METABOLIC PANEL
BUN: 16 mg/dL (ref 6–23)
CALCIUM: 9.6 mg/dL (ref 8.4–10.5)
CO2: 28 mEq/L (ref 19–32)
Chloride: 104 mEq/L (ref 96–112)
Creatinine, Ser: 0.97 mg/dL (ref 0.40–1.20)
GFR: 67.41 mL/min (ref >60.00)
Glucose, Bld: 90 mg/dL (ref 70–99)
Potassium: 4.3 mEq/L (ref 3.5–5.1)
Sodium: 140 mEq/L (ref 135–145)

## 2016-03-09 LAB — CBC WITH DIFFERENTIAL/PLATELET
BASOS ABS: 0 10*3/uL (ref 0.0–0.1)
Basophils Relative: 0.4 % (ref 0.0–3.0)
EOS PCT: 1.6 % (ref 0.0–5.0)
Eosinophils Absolute: 0.1 10*3/uL (ref 0.0–0.7)
HCT: 39.5 % (ref 36.0–46.0)
Hemoglobin: 13.3 g/dL (ref 12.0–15.0)
LYMPHS ABS: 2 10*3/uL (ref 0.7–4.0)
Lymphocytes Relative: 28 % (ref 12.0–46.0)
MCHC: 33.7 g/dL (ref 30.0–36.0)
MCV: 81.5 fl (ref 78.0–100.0)
Monocytes Absolute: 0.6 10*3/uL (ref 0.1–1.0)
Monocytes Relative: 8.8 % (ref 3.0–12.0)
NEUTROS ABS: 4.3 10*3/uL (ref 1.4–7.7)
NEUTROS PCT: 61.2 % (ref 43.0–77.0)
PLATELETS: 362 10*3/uL (ref 150.0–400.0)
RBC: 4.86 Mil/uL (ref 3.87–5.11)
RDW: 14.3 % (ref 11.5–15.5)
WBC: 7 10*3/uL (ref 4.0–10.5)

## 2016-03-09 LAB — URINALYSIS
Bilirubin Urine: NEGATIVE
KETONES UR: NEGATIVE
Leukocytes, UA: NEGATIVE
Nitrite: NEGATIVE
SPECIFIC GRAVITY, URINE: 1.02 (ref 1.000–1.030)
Total Protein, Urine: NEGATIVE
URINE GLUCOSE: NEGATIVE
UROBILINOGEN UA: 0.2 (ref 0.0–1.0)
pH: 6 (ref 5.0–8.0)

## 2016-03-09 LAB — HEPATIC FUNCTION PANEL
ALT: 10 U/L (ref 0–35)
AST: 13 U/L (ref 0–37)
Albumin: 4 g/dL (ref 3.5–5.2)
Alkaline Phosphatase: 43 U/L (ref 39–117)
BILIRUBIN DIRECT: 0.1 mg/dL (ref 0.0–0.3)
BILIRUBIN TOTAL: 0.3 mg/dL (ref 0.2–1.2)
Total Protein: 7.4 g/dL (ref 6.0–8.3)

## 2016-03-09 LAB — LIPID PANEL
CHOL/HDL RATIO: 3
Cholesterol: 143 mg/dL (ref 0–200)
HDL: 45.4 mg/dL (ref >39.00)
LDL CALC: 71 mg/dL (ref 0–99)
NonHDL: 97.14
TRIGLYCERIDES: 130 mg/dL (ref 0.0–149.0)
VLDL: 26 mg/dL (ref 0.0–40.0)

## 2016-03-09 LAB — TSH: TSH: 1.91 u[IU]/mL (ref 0.35–4.50)

## 2016-03-09 NOTE — Progress Notes (Signed)
Subjective:  Patient ID: Heather Patterson, female    DOB: March 04, 1975  Age: 41 y.o. MRN: [id number]  CC: Annual Exam   HPI Heather Patterson presents for a well exam  Outpatient Medications Prior to Visit  Medication Sig Dispense Refill  . ergocalciferol (VITAMIN D2) 50000 UNITS capsule Take 1 capsule (50,000 Units total) by mouth once a week. 6 capsule 0  . Iron Polysacch Cmplx-B12-FA 150-0.025-1 MG CAPS Take 1 capsule by mouth daily. 90 each 2  . neomycin-polymyxin-hydrocortisone (CORTISPORIN) otic solution Place 3 drops into the left ear 4 (four) times daily. (Patient not taking: Reported on 03/09/2016) 10 mL 3   No facility-administered medications prior to visit.     ROS Review of Systems  Constitutional: Negative for activity change, appetite change, chills, fatigue and unexpected weight change.  HENT: Negative for congestion, mouth sores and sinus pressure.   Eyes: Negative for visual disturbance.  Respiratory: Negative for cough and chest tightness.   Gastrointestinal: Negative for abdominal pain and nausea.  Genitourinary: Negative for difficulty urinating, frequency and vaginal pain.  Musculoskeletal: Negative for back pain and gait problem.  Skin: Negative for pallor and rash.  Neurological: Negative for dizziness, tremors, weakness, numbness and headaches.  Psychiatric/Behavioral: Negative for confusion, sleep disturbance and suicidal ideas.    Objective:  BP 120/72   Pulse 97   Ht 5\' 8"  (1.727 m)   Wt 141 lb (64 kg)   SpO2 97%   BMI 21.44 kg/m   BP Readings from Last 3 Encounters:  03/09/16 120/72  06/22/14 124/82  04/10/14 120/80    Wt Readings from Last 3 Encounters:  03/09/16 141 lb (64 kg)  06/22/14 151 lb (68.5 kg)  04/10/14 151 lb (68.5 kg)    Physical Exam  Constitutional: She appears well-developed. No distress.  HENT:  Head: Normocephalic.  Right Ear: External ear normal.  Left Ear: External ear normal.  Nose: Nose normal.  Mouth/Throat:  Oropharynx is clear and moist.  Eyes: Conjunctivae are normal. Pupils are equal, round, and reactive to light. Right eye exhibits no discharge. Left eye exhibits no discharge.  Neck: Normal range of motion. Neck supple. No JVD present. No tracheal deviation present. No thyromegaly present.  Cardiovascular: Normal rate, regular rhythm and normal heart sounds.   Pulmonary/Chest: No stridor. No respiratory distress. She has no wheezes.  Abdominal: Soft. Bowel sounds are normal. She exhibits no distension and no mass. There is no tenderness. There is no rebound and no guarding.  Musculoskeletal: She exhibits no edema or tenderness.  Lymphadenopathy:    She has no cervical adenopathy.  Neurological: She displays normal reflexes. No cranial nerve deficit. She exhibits normal muscle tone. Coordination normal.  Skin: No rash noted. No erythema.  Psychiatric: She has a normal mood and affect. Her behavior is normal. Judgment and thought content normal.    Lab Results  Component Value Date   WBC 6.9 04/13/2015   HGB 13.0 04/13/2015   HCT 39.0 04/13/2015   PLT 273.0 04/13/2015   GLUCOSE 99 04/10/2014   CHOL 124 03/29/2012   TRIG 55.0 03/29/2012   HDL 40.00 03/29/2012   LDLCALC 73 03/29/2012   ALT 14 04/10/2014   AST 16 04/10/2014   NA 138 04/10/2014   K 4.0 04/10/2014   CL 104 04/10/2014   CREATININE 0.75 04/10/2014   BUN 10 04/10/2014   CO2 27 04/10/2014   TSH 3.28 04/13/2015    Dg Chest 2 View  Addendum Date: 05/28/2012   **ADDENDUM** CREATED:  05/28/2012 17:00:12 **END ADDENDUM** SIGNED BY: Venetia Night. Cloutier, M.D.  Result Date: 05/28/2012 *RADIOLOGY REPORT* Clinical Data: Shortness of breath for 4 days. CHEST - 2 VIEW Comparison: 12/05/2004. Findings: The lungs appear hyperexpanded but clear.  No infiltrates, masses or edema.  The heart, mediastinum and hilar contours are normal.  There are no effusions or pneumothoraces. There are no acute bony changes. IMPRESSION: Slight  hyperexpansion. There are no acute findings. Original Report Authenticated By: Sander Radon, M.D.    Assessment & Plan:   There are no diagnoses linked to this encounter. I am having Ms. Winship maintain her Iron Polysacch Cmplx-B12-FA, ergocalciferol, and neomycin-polymyxin-hydrocortisone.  No orders of the defined types were placed in this encounter.    Follow-up: No Follow-up on file.  Sonda Primes, MD

## 2016-03-09 NOTE — Assessment & Plan Note (Signed)
Labs

## 2016-03-09 NOTE — Progress Notes (Signed)
Pre visit review using our clinic review tool, if applicable. No additional management support is needed unless otherwise documented below in the visit note. 

## 2016-12-05 ENCOUNTER — Other Ambulatory Visit: Payer: Self-pay | Admitting: Internal Medicine

## 2016-12-05 ENCOUNTER — Encounter: Payer: Self-pay | Admitting: Internal Medicine

## 2016-12-05 DIAGNOSIS — Z111 Encounter for screening for respiratory tuberculosis: Secondary | ICD-10-CM

## 2016-12-05 NOTE — Telephone Encounter (Signed)
Are you okay with ordering a quantiferon?

## 2016-12-08 ENCOUNTER — Other Ambulatory Visit (INDEPENDENT_AMBULATORY_CARE_PROVIDER_SITE_OTHER): Payer: BC Managed Care – PPO

## 2016-12-08 DIAGNOSIS — Z0000 Encounter for general adult medical examination without abnormal findings: Secondary | ICD-10-CM | POA: Diagnosis not present

## 2016-12-08 DIAGNOSIS — D500 Iron deficiency anemia secondary to blood loss (chronic): Secondary | ICD-10-CM | POA: Diagnosis not present

## 2016-12-08 DIAGNOSIS — Z111 Encounter for screening for respiratory tuberculosis: Secondary | ICD-10-CM

## 2016-12-08 LAB — VITAMIN D 25 HYDROXY (VIT D DEFICIENCY, FRACTURES): VITD: 33.56 ng/mL (ref 30.00–100.00)

## 2016-12-08 LAB — IBC PANEL
IRON: 111 ug/dL (ref 42–145)
SATURATION RATIOS: 29.5 % (ref 20.0–50.0)
TRANSFERRIN: 269 mg/dL (ref 212.0–360.0)

## 2016-12-12 LAB — QUANTIFERON TB GOLD ASSAY (BLOOD)
QUANTIFERON(R)-TB GOLD: NEGATIVE
Quantiferon Nil Value: 0.03 IU/mL

## 2017-09-04 ENCOUNTER — Ambulatory Visit (INDEPENDENT_AMBULATORY_CARE_PROVIDER_SITE_OTHER): Payer: BC Managed Care – PPO | Admitting: Internal Medicine

## 2017-09-04 ENCOUNTER — Encounter: Payer: Self-pay | Admitting: Internal Medicine

## 2017-09-04 VITALS — BP 124/76 | HR 71 | Temp 98.6°F | Ht 68.0 in | Wt 125.0 lb

## 2017-09-04 DIAGNOSIS — Z0000 Encounter for general adult medical examination without abnormal findings: Secondary | ICD-10-CM

## 2017-09-04 DIAGNOSIS — D500 Iron deficiency anemia secondary to blood loss (chronic): Secondary | ICD-10-CM

## 2017-09-04 DIAGNOSIS — F4323 Adjustment disorder with mixed anxiety and depressed mood: Secondary | ICD-10-CM | POA: Insufficient documentation

## 2017-09-04 MED ORDER — ALPRAZOLAM 0.5 MG PO TBDP: 0.5000 mg | ORAL_TABLET | Freq: Two times a day (BID) | ORAL | 1 refills | Status: DC | PRN

## 2017-09-04 MED ORDER — ESCITALOPRAM OXALATE 5 MG PO TABS: 5.0000 mg | ORAL_TABLET | Freq: Every day | ORAL | 5 refills | Status: DC

## 2017-09-04 NOTE — Progress Notes (Signed)
Subjective:  Patient ID: Heather Patterson, female    DOB: [birth year]  Age: 42 y.o. MRN: [id number]  CC: No chief complaint on file.   HPI Cori Justus presents for well exam C/o anxiety, wt loss - planning to separate this week. Can't sleep Doing DNP  2 more years  Outpatient Medications Prior to Visit  Medication Sig Dispense Refill  . ergocalciferol (VITAMIN D2) 50000 UNITS capsule Take 1 capsule (50,000 Units total) by mouth once a week. 6 capsule 0  . Iron Polysacch Cmplx-B12-FA 150-0.025-1 MG CAPS Take 1 capsule by mouth daily. 90 each 2  . neomycin-polymyxin-hydrocortisone (CORTISPORIN) otic solution Place 3 drops into the left ear 4 (four) times daily. 10 mL 3   No facility-administered medications prior to visit.     ROS: Review of Systems  Constitutional: Negative for activity change, appetite change, chills, fatigue and unexpected weight change.  HENT: Negative for congestion, mouth sores and sinus pressure.   Eyes: Negative for visual disturbance.  Respiratory: Negative for cough and chest tightness.   Gastrointestinal: Negative for abdominal pain and nausea.  Genitourinary: Negative for difficulty urinating, frequency and vaginal pain.  Musculoskeletal: Negative for back pain and gait problem.  Skin: Negative for pallor and rash.  Neurological: Negative for dizziness, tremors, weakness, numbness and headaches.  Psychiatric/Behavioral: Positive for dysphoric mood and sleep disturbance. Negative for confusion, self-injury and suicidal ideas. The patient is nervous/anxious.     Objective:  BP 124/76 (BP Location: Left Arm, Patient Position: Sitting, Cuff Size: Normal)   Pulse 71   Temp 98.6 F (37 C) (Oral)   Ht 5\' 8"  (1.727 m)   Wt 125 lb (56.7 kg)   SpO2 98%   BMI 19.01 kg/m   BP Readings from Last 3 Encounters:  09/04/17 124/76  03/09/16 120/72  06/22/14 124/82    Wt Readings from Last 3 Encounters:  09/04/17 125 lb (56.7 kg)  03/09/16 141 lb (64 kg)    06/22/14 151 lb (68.5 kg)    Physical Exam  Constitutional: She appears well-developed. No distress.  HENT:  Head: Normocephalic.  Right Ear: External ear normal.  Left Ear: External ear normal.  Nose: Nose normal.  Mouth/Throat: Oropharynx is clear and moist.  Eyes: Pupils are equal, round, and reactive to light. Conjunctivae are normal. Right eye exhibits no discharge. Left eye exhibits no discharge.  Neck: Normal range of motion. Neck supple. No JVD present. No tracheal deviation present. No thyromegaly present.  Cardiovascular: Normal rate, regular rhythm and normal heart sounds.  Pulmonary/Chest: No stridor. No respiratory distress. She has no wheezes.  Abdominal: Soft. Bowel sounds are normal. She exhibits no distension and no mass. There is no tenderness. There is no rebound and no guarding.  Musculoskeletal: She exhibits no edema or tenderness.  Lymphadenopathy:    She has no cervical adenopathy.  Neurological: She displays normal reflexes. No cranial nerve deficit. She exhibits normal muscle tone. Coordination normal.  Skin: No rash noted. No erythema.  Psychiatric: Her behavior is normal. Judgment and thought content normal.  tearful, upset  Lab Results  Component Value Date   WBC 7.0 03/09/2016   HGB 13.3 03/09/2016   HCT 39.5 03/09/2016   PLT 362.0 03/09/2016   GLUCOSE 90 03/09/2016   CHOL 143 03/09/2016   TRIG 130.0 03/09/2016   HDL 45.40 03/09/2016   LDLCALC 71 03/09/2016   ALT 10 03/09/2016   AST 13 03/09/2016   NA 140 03/09/2016   K 4.3 03/09/2016   CL  104 03/09/2016   CREATININE 0.97 03/09/2016   BUN 16 03/09/2016   CO2 28 03/09/2016   TSH 1.91 03/09/2016    Dg Chest 2 View  Addendum Date: 05/28/2012   **ADDENDUM** CREATED: 05/28/2012 17:00:12 **END ADDENDUM** SIGNED BY: Venetia NightMichael G. Cloutier, M.D.  Result Date: 05/28/2012 *RADIOLOGY REPORT* Clinical Data: Shortness of breath for 4 days. CHEST - 2 VIEW Comparison: 12/05/2004. Findings: The lungs appear  hyperexpanded but clear.  No infiltrates, masses or edema.  The heart, mediastinum and hilar contours are normal.  There are no effusions or pneumothoraces. There are no acute bony changes. IMPRESSION: Slight hyperexpansion. There are no acute findings. Original Report Authenticated By: Sander RadonMichael Cloutier, M.D.    Assessment & Plan:   There are no diagnoses linked to this encounter.   No orders of the defined types were placed in this encounter.    Follow-up: No follow-ups on file.  Sonda PrimesAlex Stuti Sandin, MD

## 2017-09-04 NOTE — Assessment & Plan Note (Signed)
We discussed age appropriate health related issues, including available/recomended screening tests and vaccinations. We discussed a need for adhering to healthy diet and exercise. Labs were ordered to be later reviewed . All questions were answered.   

## 2017-09-04 NOTE — Assessment & Plan Note (Signed)
CBC

## 2017-09-04 NOTE — Assessment & Plan Note (Signed)
Discussed Start Lexapro, prn Xanax

## 2017-09-04 NOTE — Patient Instructions (Signed)
Valerian root 

## 2017-09-08 ENCOUNTER — Encounter: Payer: Self-pay | Admitting: Internal Medicine

## 2017-09-09 ENCOUNTER — Encounter: Payer: Self-pay | Admitting: Internal Medicine

## 2017-09-10 ENCOUNTER — Other Ambulatory Visit (INDEPENDENT_AMBULATORY_CARE_PROVIDER_SITE_OTHER): Payer: BC Managed Care – PPO

## 2017-09-10 ENCOUNTER — Encounter: Payer: Self-pay | Admitting: Internal Medicine

## 2017-09-10 DIAGNOSIS — Z0000 Encounter for general adult medical examination without abnormal findings: Secondary | ICD-10-CM | POA: Diagnosis not present

## 2017-09-10 LAB — LIPID PANEL
CHOL/HDL RATIO: 3
Cholesterol: 137 mg/dL (ref 0–200)
HDL: 49 mg/dL (ref >39.00)
LDL CALC: 70 mg/dL (ref 0–99)
NONHDL: 88.14
Triglycerides: 91 mg/dL (ref 0.0–149.0)
VLDL: 18.2 mg/dL (ref 0.0–40.0)

## 2017-09-10 LAB — BASIC METABOLIC PANEL
BUN: 11 mg/dL (ref 6–23)
CHLORIDE: 103 meq/L (ref 96–112)
CO2: 26 meq/L (ref 19–32)
Calcium: 9.3 mg/dL (ref 8.4–10.5)
Creatinine, Ser: 0.96 mg/dL (ref 0.40–1.20)
GFR: 67.72 mL/min (ref >60.00)
GLUCOSE: 106 mg/dL — AB (ref 70–99)
POTASSIUM: 3.5 meq/L (ref 3.5–5.1)
SODIUM: 139 meq/L (ref 135–145)

## 2017-09-10 LAB — URINALYSIS, ROUTINE W REFLEX MICROSCOPIC
Bilirubin Urine: NEGATIVE
Ketones, ur: NEGATIVE
Leukocytes, UA: NEGATIVE
Nitrite: NEGATIVE
Specific Gravity, Urine: 1.015 (ref 1.000–1.030)
URINE GLUCOSE: NEGATIVE
Urobilinogen, UA: 0.2 (ref 0.0–1.0)
pH: 6 (ref 5.0–8.0)

## 2017-09-10 LAB — HEPATIC FUNCTION PANEL
ALT: 13 U/L (ref 0–35)
AST: 13 U/L (ref 0–37)
Albumin: 4.2 g/dL (ref 3.5–5.2)
Alkaline Phosphatase: 23 U/L — ABNORMAL LOW (ref 39–117)
BILIRUBIN DIRECT: 0.1 mg/dL (ref 0.0–0.3)
Total Bilirubin: 0.6 mg/dL (ref 0.2–1.2)
Total Protein: 7 g/dL (ref 6.0–8.3)

## 2017-09-10 LAB — CBC WITH DIFFERENTIAL/PLATELET
Basophils Absolute: 0.1 10*3/uL (ref 0.0–0.1)
Basophils Relative: 1 % (ref 0.0–3.0)
EOS ABS: 0.1 10*3/uL (ref 0.0–0.7)
Eosinophils Relative: 1.1 % (ref 0.0–5.0)
HCT: 40.5 % (ref 36.0–46.0)
Hemoglobin: 13.5 g/dL (ref 12.0–15.0)
LYMPHS ABS: 1.7 10*3/uL (ref 0.7–4.0)
Lymphocytes Relative: 31.3 % (ref 12.0–46.0)
MCHC: 33.3 g/dL (ref 30.0–36.0)
MCV: 84.8 fl (ref 78.0–100.0)
MONO ABS: 0.6 10*3/uL (ref 0.1–1.0)
Monocytes Relative: 11.8 % (ref 3.0–12.0)
NEUTROS PCT: 54.8 % (ref 43.0–77.0)
Neutro Abs: 3 10*3/uL (ref 1.4–7.7)
Platelets: 240 10*3/uL (ref 150.0–400.0)
RBC: 4.77 Mil/uL (ref 3.87–5.11)
RDW: 13.7 % (ref 11.5–15.5)
WBC: 5.4 10*3/uL (ref 4.0–10.5)

## 2017-09-10 LAB — TSH: TSH: 2.35 u[IU]/mL (ref 0.35–4.50)

## 2017-09-12 ENCOUNTER — Other Ambulatory Visit: Payer: Self-pay | Admitting: Internal Medicine

## 2017-09-12 DIAGNOSIS — R3129 Other microscopic hematuria: Secondary | ICD-10-CM

## 2017-09-12 NOTE — Addendum Note (Signed)
Addended by: Scarlett PrestoFRIEDENBACH, [place] Dalporto on: 09/12/2017 10:30 AM   Modules accepted: Orders

## 2017-09-17 MED ORDER — ESCITALOPRAM OXALATE 10 MG PO TABS: 10.0000 mg | ORAL_TABLET | Freq: Every day | ORAL | 0 refills | Status: DC

## 2017-09-24 ENCOUNTER — Ambulatory Visit
Admission: RE | Admit: 2017-09-24 | Discharge: 2017-09-24 | Disposition: A | Discharge status: Home / Self Care | Payer: BC Managed Care – PPO | Source: Ambulatory Visit | Attending: Internal Medicine | Admitting: Internal Medicine

## 2017-09-24 DIAGNOSIS — R3129 Other microscopic hematuria: Secondary | ICD-10-CM

## 2017-09-25 ENCOUNTER — Encounter (INDEPENDENT_AMBULATORY_CARE_PROVIDER_SITE_OTHER): Payer: Self-pay

## 2017-09-29 ENCOUNTER — Encounter: Payer: Self-pay | Admitting: Internal Medicine

## 2017-10-01 MED ORDER — ALPRAZOLAM 1 MG PO TBDP
1.0000 mg | ORAL_TABLET | Freq: Two times a day (BID) | ORAL | 1 refills | Status: DC | PRN
Start: 2017-10-01 — End: 2018-04-05

## 2017-11-15 ENCOUNTER — Other Ambulatory Visit: Payer: Self-pay | Admitting: Internal Medicine

## 2017-11-15 DIAGNOSIS — Z758 Other problems related to medical facilities and other health care: Secondary | ICD-10-CM

## 2017-11-19 ENCOUNTER — Other Ambulatory Visit: Payer: Self-pay | Admitting: Obstetrics & Gynecology

## 2017-11-19 ENCOUNTER — Ambulatory Visit (INDEPENDENT_AMBULATORY_CARE_PROVIDER_SITE_OTHER): Payer: BC Managed Care – PPO | Admitting: Obstetrics & Gynecology

## 2017-11-19 ENCOUNTER — Encounter: Payer: Self-pay | Admitting: Obstetrics & Gynecology

## 2017-11-19 VITALS — BP 110/70 | Ht 68.0 in | Wt 123.0 lb

## 2017-11-19 DIAGNOSIS — Z01419 Encounter for gynecological examination (general) (routine) without abnormal findings: Secondary | ICD-10-CM | POA: Diagnosis not present

## 2017-11-19 DIAGNOSIS — Z3009 Encounter for other general counseling and advice on contraception: Secondary | ICD-10-CM

## 2017-11-19 DIAGNOSIS — N6320 Unspecified lump in the left breast, unspecified quadrant: Secondary | ICD-10-CM

## 2017-11-19 DIAGNOSIS — Z1151 Encounter for screening for human papillomavirus (HPV): Secondary | ICD-10-CM | POA: Diagnosis not present

## 2017-11-19 DIAGNOSIS — Z113 Encounter for screening for infections with a predominantly sexual mode of transmission: Secondary | ICD-10-CM | POA: Diagnosis not present

## 2017-11-19 NOTE — Progress Notes (Signed)
Heather Patterson Dec 18, 1975 [id number]   History:    42 y.o. G3P3L3 Separated.  Children 7, 11,42 yo.  RP:  New (>3 yrs) patient presenting for annual gyn exam   HPI: Menses regular normal every months.  No breakthrough bleeding.  No pelvic pain.  Not currently sexually active.  Urine and bowel movements normal.  Breasts normal.  Health labs with family physician were normal this year.  Patient going through a separation with her husband.  Situational depression controlled on an antidepressant.  Occasionally taking an antianxiety medication.  Body mass index 18.7.  Exercising regularly.  Past medical history,surgical history, family history and social history were all reviewed and documented in the EPIC chart.  Gynecologic History Patient's last menstrual period was 11/04/2017. Contraception: abstinence Last Pap: 4 yrs ago. Results were: normal Last mammogram: 2017. Results were: Negative Bone Density:  Never Colonoscopy: Never  Obstetric History OB History  Gravida Para Term Preterm AB Living  3 3       3   SAB TAB Ectopic Multiple Live Births               # Outcome Date GA Lbr Len/2nd Weight Sex Delivery Anes PTL Lv  3 Para           2 Para           1 Para              ROS: A ROS was performed and pertinent positives and negatives are included in the history.  GENERAL: No fevers or chills. HEENT: No change in vision, no earache, sore throat or sinus congestion. NECK: No pain or stiffness. CARDIOVASCULAR: No chest pain or pressure. No palpitations. PULMONARY: No shortness of breath, cough or wheeze. GASTROINTESTINAL: No abdominal pain, nausea, vomiting or diarrhea, melena or bright red blood per rectum. GENITOURINARY: No urinary frequency, urgency, hesitancy or dysuria. MUSCULOSKELETAL: No joint or muscle pain, no back pain, no recent trauma. DERMATOLOGIC: No rash, no itching, no lesions. ENDOCRINE: No polyuria, polydipsia, no heat or cold intolerance. No recent change in weight.  HEMATOLOGICAL: No anemia or easy bruising or bleeding. NEUROLOGIC: No headache, seizures, numbness, tingling or weakness. PSYCHIATRIC: No depression, no loss of interest in normal activity or change in sleep pattern.     Exam:   BP 110/70   Ht 5\' 8"  (1.727 m)   Wt 123 lb (55.8 kg)   LMP 11/04/2017   BMI 18.70 kg/m   Body mass index is 18.7 kg/m.  General appearance : Well developed well nourished female. No acute distress HEENT: Eyes: no retinal hemorrhage or exudates,  Neck supple, trachea midline, no carotid bruits, no thyroidmegaly Lungs: Clear to auscultation, no rhonchi or wheezes, or rib retractions  Heart: Regular rate and rhythm, no murmurs or gallops Breast:Examined in sitting and supine position were symmetrical in appearance, no palpable masses or tenderness,  no skin retraction, no nipple inversion, no nipple discharge, no skin discoloration, no axillary or supraclavicular lymphadenopathy Abdomen: no palpable masses or tenderness, no rebound or guarding Extremities: no edema or skin discoloration or tenderness  Pelvic: Vulva: Normal             Vagina: No gross lesions or discharge  Cervix: No gross lesions or discharge.  Pap/HPV HR, Gono-Chlam done  Uterus  RV, normal size, shape and consistency, non-tender and mobile  Adnexa  Without masses or tenderness  Anus: Normal   Assessment/Plan:  42 y.o. female for annual exam   1.  Encounter for routine gynecological examination with Papanicolaou smear of cervix Normal gynecologic exam.  Pap with high-risk HPV done today.  Breast exam normal.  Will schedule screening mammogram.  Health labs with family physician.  Good body mass index at 18.7.  Continue to exercise regularly.  2. Encounter for other general counseling or advice on contraception Declines contraception.  Currently abstinent.  3. Screen for STD (sexually transmitted disease) Gonorrhea and Chlamydia done on the Pap test.  Declines HIV, syphilis, hepatitis B  and C screening.  Genia Del MD, 9:25 AM 11/19/2017

## 2017-11-19 NOTE — Addendum Note (Signed)
Addended by: Berna SpareASTILLO, Ivann Trimarco A on: 11/19/2017 10:22 AM   Modules accepted: Orders

## 2017-11-19 NOTE — Patient Instructions (Signed)
1. Encounter for routine gynecological examination with Papanicolaou smear of cervix Normal gynecologic exam.  Pap with high-risk HPV done today.  Breast exam normal.  Will schedule screening mammogram.  Health labs with family physician.  Good body mass index at 18.7.  Continue to exercise regularly.  2. Encounter for other general counseling or advice on contraception Declines contraception.  Currently abstinent.  3. Screen for STD (sexually transmitted disease) Gonorrhea and Chlamydia done on the Pap test.  Declines HIV, syphilis, hepatitis B and C screening.  Heather Patterson, it was a pleasure seeing you today!  I will inform you of your results as soon as they are available.

## 2017-11-21 ENCOUNTER — Encounter: Payer: Self-pay | Admitting: *Deleted

## 2017-11-21 LAB — PAP IG, CT-NG NAA, HPV HIGH-RISK
C. trachomatis RNA, TMA: NOT DETECTED
HPV DNA HIGH RISK: NOT DETECTED
N. gonorrhoeae RNA, TMA: NOT DETECTED

## 2017-11-27 ENCOUNTER — Ambulatory Visit
Admission: RE | Admit: 2017-11-27 | Discharge: 2017-11-27 | Disposition: A | Discharge status: Home / Self Care | Payer: BC Managed Care – PPO | Source: Ambulatory Visit | Attending: Obstetrics & Gynecology | Admitting: Obstetrics & Gynecology

## 2017-11-27 DIAGNOSIS — N6320 Unspecified lump in the left breast, unspecified quadrant: Secondary | ICD-10-CM

## 2017-12-04 ENCOUNTER — Other Ambulatory Visit: Payer: Self-pay | Admitting: Internal Medicine

## 2017-12-04 MED ORDER — ESCITALOPRAM OXALATE 20 MG PO TABS: 20.0000 mg | ORAL_TABLET | Freq: Every day | ORAL | 3 refills | Status: DC

## 2017-12-18 ENCOUNTER — Ambulatory Visit: Payer: BC Managed Care – PPO | Admitting: Internal Medicine

## 2017-12-31 ENCOUNTER — Ambulatory Visit: Payer: BC Managed Care – PPO | Admitting: Internal Medicine

## 2017-12-31 ENCOUNTER — Encounter: Payer: Self-pay | Admitting: Internal Medicine

## 2017-12-31 DIAGNOSIS — F4323 Adjustment disorder with mixed anxiety and depressed mood: Secondary | ICD-10-CM

## 2017-12-31 MED ORDER — ESCITALOPRAM OXALATE 20 MG PO TABS: 20.0000 mg | ORAL_TABLET | Freq: Every day | ORAL | 1 refills | Status: DC

## 2017-12-31 MED ORDER — BUPROPION HCL ER (XL) 150 MG PO TB24: 150.0000 mg | ORAL_TABLET | Freq: Every day | ORAL | 5 refills | Status: DC

## 2017-12-31 NOTE — Assessment & Plan Note (Signed)
7/19 --- anxiety, wt loss - better;  planning to legally separate this week. Can't sleep. 3 kids Doing DNP  1.5 more years Lexapro - increase dose Rareprn Xanax

## 2017-12-31 NOTE — Progress Notes (Signed)
Subjective:  Patient ID: Heather Patterson, female    DOB: 12-07-1975  Age: 42 y.o. MRN: [id number]  CC: No chief complaint on file.   HPI BANEEN WIESELER presents for stress and situational depression C/o fatigue, lack of focus    Outpatient Medications Prior to Visit  Medication Sig Dispense Refill  . ALPRAZolam (NIRAVAM) 1 MG dissolvable tablet Take 1 tablet (1 mg total) by mouth 2 (two) times daily as needed for anxiety. 60 tablet 1  . ergocalciferol (VITAMIN D2) 50000 UNITS capsule Take 1 capsule (50,000 Units total) by mouth once a week. 6 capsule 0  . escitalopram (LEXAPRO) 20 MG tablet Take 1 tablet (20 mg total) by mouth daily. 30 tablet 3  . Iron Polysacch Cmplx-B12-FA 150-0.025-1 MG CAPS Take 1 capsule by mouth daily. 90 each 2   No facility-administered medications prior to visit.     ROS: Review of Systems  Constitutional: Negative for activity change, appetite change, chills, fatigue and unexpected weight change.  HENT: Negative for congestion, mouth sores and sinus pressure.   Eyes: Negative for visual disturbance.  Respiratory: Negative for cough and chest tightness.   Gastrointestinal: Negative for abdominal pain and nausea.  Genitourinary: Negative for difficulty urinating, frequency and vaginal pain.  Musculoskeletal: Negative for back pain and gait problem.  Skin: Negative for pallor and rash.  Neurological: Negative for dizziness, tremors, weakness, numbness and headaches.  Psychiatric/Behavioral: Positive for decreased concentration, dysphoric mood and sleep disturbance. Negative for confusion and suicidal ideas. The patient is nervous/anxious.     Objective:  BP 112/74 (BP Location: Left Arm, Patient Position: Sitting, Cuff Size: Normal)   Pulse 70   Temp 98.1 F (36.7 C) (Oral)   Ht 5\' 8"  (1.727 m)   Wt 131 lb (59.4 kg)   SpO2 98%   BMI 19.92 kg/m   BP Readings from Last 3 Encounters:  12/31/17 112/74  11/19/17 110/70  09/04/17 124/76    Wt  Readings from Last 3 Encounters:  12/31/17 131 lb (59.4 kg)  11/19/17 123 lb (55.8 kg)  09/04/17 125 lb (56.7 kg)    Physical Exam  Constitutional: She appears well-developed. No distress.  HENT:  Head: Normocephalic.  Right Ear: External ear normal.  Left Ear: External ear normal.  Nose: Nose normal.  Mouth/Throat: Oropharynx is clear and moist.  Eyes: Pupils are equal, round, and reactive to light. Conjunctivae are normal. Right eye exhibits no discharge. Left eye exhibits no discharge.  Neck: Normal range of motion. Neck supple. No JVD present. No tracheal deviation present. No thyromegaly present.  Cardiovascular: Normal rate, regular rhythm and normal heart sounds.  Pulmonary/Chest: No stridor. No respiratory distress. She has no wheezes.  Abdominal: Soft. Bowel sounds are normal. She exhibits no distension and no mass. There is no tenderness. There is no rebound and no guarding.  Musculoskeletal: She exhibits no edema or tenderness.  Lymphadenopathy:    She has no cervical adenopathy.  Neurological: She displays normal reflexes. No cranial nerve deficit. She exhibits normal muscle tone. Coordination normal.  Skin: No rash noted. No erythema.  Psychiatric: She has a normal mood and affect. Her behavior is normal. Judgment and thought content normal.    Lab Results  Component Value Date   WBC 5.4 09/10/2017   HGB 13.5 09/10/2017   HCT 40.5 09/10/2017   PLT 240.0 09/10/2017   GLUCOSE 106 (H) 09/10/2017   CHOL 137 09/10/2017   TRIG 91.0 09/10/2017   HDL 49.00 09/10/2017  LDLCALC 70 09/10/2017   ALT 13 09/10/2017   AST 13 09/10/2017   NA 139 09/10/2017   K 3.5 09/10/2017   CL 103 09/10/2017   CREATININE 0.96 09/10/2017   BUN 11 09/10/2017   CO2 26 09/10/2017   TSH 2.35 09/10/2017    US Breast Ltd Uni Left Inc Axilla  Result Date: 11/27/2017 CLINICAL DATA:  Follow-up for probably benign left breast masses, 1 of which was previously biopsied in 2017 with pathology  revealing fibrocystic changes. EXAM: DIGITAL DIAGNOSTIC BILATERAL MAMMOGRAM WITH CAD AND TOMO LEFT BREAST ULTRASOUND COMPARISON:  Previous exam(s). ACR Breast Density Category d: The breast tissue is extremely dense, which lowers the sensitivity of mammography. FINDINGS: No suspicious masses or calcifications are seen in either breast. The oval mass in the far upper posterior left breast with associated biopsy marking clip appears unchanged. There is no mammographic evidence of malignancy in either breast. Mammographic images were processed with CAD. Targeted ultrasound of the left breast was performed. The oval circumscribed hypoechoic mass in the left breast at 2 o'clock 9 cm from nipple measures 1.3 x 0.7 x 1.4 cm, unchanged to slightly smaller previously measuring 1.4 x 0.8 x 1.4 cm. The oval circumscribed hypoechoic mass in the left breast at 3 o'clock 3 cm from nipple measures 0.6 x 0.4 x 0.7 cm, also stable to slightly smaller previously measuring 0.8 x 0.4 x 0.8 cm. IMPRESSION: No findings of malignancy in either breast. RECOMMENDATION: Screening mammogram in one year.(Code:SM-B-01Y) I have discussed the findings and recommendations with the patient. Results were also provided in writing at the conclusion of the visit. If applicable, a reminder letter will be sent to the patient regarding the next appointment. BI-RADS CATEGORY  2: Benign. Electronically Signed   By: Edwin Cap M.D.   On: 11/27/2017 09:32   Mm Diag Breast Tomo Bilateral  Result Date: 11/27/2017 CLINICAL DATA:  Follow-up for probably benign left breast masses, 1 of which was previously biopsied in 2017 with pathology revealing fibrocystic changes. EXAM: DIGITAL DIAGNOSTIC BILATERAL MAMMOGRAM WITH CAD AND TOMO LEFT BREAST ULTRASOUND COMPARISON:  Previous exam(s). ACR Breast Density Category d: The breast tissue is extremely dense, which lowers the sensitivity of mammography. FINDINGS: No suspicious masses or calcifications are seen in  either breast. The oval mass in the far upper posterior left breast with associated biopsy marking clip appears unchanged. There is no mammographic evidence of malignancy in either breast. Mammographic images were processed with CAD. Targeted ultrasound of the left breast was performed. The oval circumscribed hypoechoic mass in the left breast at 2 o'clock 9 cm from nipple measures 1.3 x 0.7 x 1.4 cm, unchanged to slightly smaller previously measuring 1.4 x 0.8 x 1.4 cm. The oval circumscribed hypoechoic mass in the left breast at 3 o'clock 3 cm from nipple measures 0.6 x 0.4 x 0.7 cm, also stable to slightly smaller previously measuring 0.8 x 0.4 x 0.8 cm. IMPRESSION: No findings of malignancy in either breast. RECOMMENDATION: Screening mammogram in one year.(Code:SM-B-01Y) I have discussed the findings and recommendations with the patient. Results were also provided in writing at the conclusion of the visit. If applicable, a reminder letter will be sent to the patient regarding the next appointment. BI-RADS CATEGORY  2: Benign. Electronically Signed   By: Edwin Cap M.D.   On: 11/27/2017 09:32    Assessment & Plan:   There are no diagnoses linked to this encounter.   No orders of the defined types were placed in  this encounter.    Follow-up: No follow-ups on file.  Walker Kehr, MD

## 2018-01-03 MED ORDER — BUPROPION HCL ER (XL) 150 MG PO TB24: 150.0000 mg | ORAL_TABLET | Freq: Every day | ORAL | 1 refills | Status: DC

## 2018-03-25 ENCOUNTER — Ambulatory Visit (INDEPENDENT_AMBULATORY_CARE_PROVIDER_SITE_OTHER): Payer: 59 | Admitting: Internal Medicine

## 2018-03-25 ENCOUNTER — Encounter: Payer: Self-pay | Admitting: Internal Medicine

## 2018-03-25 DIAGNOSIS — F4323 Adjustment disorder with mixed anxiety and depressed mood: Secondary | ICD-10-CM | POA: Diagnosis not present

## 2018-03-25 DIAGNOSIS — R69 Illness, unspecified: Secondary | ICD-10-CM | POA: Diagnosis not present

## 2018-03-25 MED ORDER — ESCITALOPRAM OXALATE 20 MG PO TABS: 20.0000 mg | ORAL_TABLET | Freq: Every day | ORAL | 3 refills | Status: DC

## 2018-03-25 MED ORDER — BUPROPION HCL ER (XL) 300 MG PO TB24: 300.0000 mg | ORAL_TABLET | Freq: Every day | ORAL | 3 refills | Status: DC

## 2018-03-25 NOTE — Progress Notes (Signed)
Subjective:  Patient ID: Heather Patterson, female    DOB: Sep 02, 1975  Age: 43 y.o. MRN: [id number]  CC: No chief complaint on file.   HPI CORTLYNN NAGI presents for depression, anxiety, stress. Overall better. Busy at work, school, kids  Outpatient Medications Prior to Visit  Medication Sig Dispense Refill  . ALPRAZolam (NIRAVAM) 1 MG dissolvable tablet Take 1 tablet (1 mg total) by mouth 2 (two) times daily as needed for anxiety. 60 tablet 1  . buPROPion (WELLBUTRIN XL) 150 MG 24 hr tablet Take 1 tablet (150 mg total) by mouth daily. 90 tablet 1  . ergocalciferol (VITAMIN D2) 50000 UNITS capsule Take 1 capsule (50,000 Units total) by mouth once a week. 6 capsule 0  . escitalopram (LEXAPRO) 20 MG tablet Take 1 tablet (20 mg total) by mouth daily. 90 tablet 1  . Iron Polysacch Cmplx-B12-FA 150-0.025-1 MG CAPS Take 1 capsule by mouth daily. 90 each 2   No facility-administered medications prior to visit.     ROS: Review of Systems  Constitutional: Negative for activity change, appetite change, chills, fatigue and unexpected weight change.  HENT: Negative for congestion, mouth sores and sinus pressure.   Eyes: Negative for visual disturbance.  Respiratory: Negative for cough and chest tightness.   Gastrointestinal: Negative for abdominal pain and nausea.  Genitourinary: Negative for difficulty urinating, frequency and vaginal pain.  Musculoskeletal: Negative for back pain and gait problem.  Skin: Negative for pallor and rash.  Neurological: Negative for dizziness, tremors, weakness, numbness and headaches.  Psychiatric/Behavioral: Positive for dysphoric mood. Negative for confusion, sleep disturbance and suicidal ideas. The patient is nervous/anxious.     Objective:  BP 126/82 (BP Location: Left Arm, Patient Position: Sitting, Cuff Size: Normal)   Pulse 80   Temp 98.1 F (36.7 C) (Oral)   Ht 5\' 8"  (1.727 m)   Wt 137 lb (62.1 kg)   SpO2 99%   BMI 20.83 kg/m   BP Readings from  Last 3 Encounters:  03/25/18 126/82  12/31/17 112/74  11/19/17 110/70    Wt Readings from Last 3 Encounters:  03/25/18 137 lb (62.1 kg)  12/31/17 131 lb (59.4 kg)  11/19/17 123 lb (55.8 kg)    Physical Exam Constitutional:      General: She is not in acute distress.    Appearance: She is well-developed.  HENT:     Head: Normocephalic.     Right Ear: External ear normal.     Left Ear: External ear normal.     Nose: Nose normal.  Eyes:     General:        Right eye: No discharge.        Left eye: No discharge.     Conjunctiva/sclera: Conjunctivae normal.     Pupils: Pupils are equal, round, and reactive to light.  Neck:     Musculoskeletal: Normal range of motion and neck supple.     Thyroid: No thyromegaly.     Vascular: No JVD.     Trachea: No tracheal deviation.  Cardiovascular:     Rate and Rhythm: Normal rate and regular rhythm.     Heart sounds: Normal heart sounds.  Pulmonary:     Effort: No respiratory distress.     Breath sounds: No stridor. No wheezing.  Abdominal:     General: Bowel sounds are normal. There is no distension.     Palpations: Abdomen is soft. There is no mass.     Tenderness: There is no  abdominal tenderness. There is no guarding or rebound.  Musculoskeletal:        General: No tenderness.  Lymphadenopathy:     Cervical: No cervical adenopathy.  Skin:    Findings: No erythema or rash.  Neurological:     Cranial Nerves: No cranial nerve deficit.     Motor: No abnormal muscle tone.     Coordination: Coordination normal.     Deep Tendon Reflexes: Reflexes normal.  Psychiatric:        Behavior: Behavior normal.        Thought Content: Thought content normal.        Judgment: Judgment normal.     Lab Results  Component Value Date   WBC 5.4 09/10/2017   HGB 13.5 09/10/2017   HCT 40.5 09/10/2017   PLT 240.0 09/10/2017   GLUCOSE 106 (H) 09/10/2017   CHOL 137 09/10/2017   TRIG 91.0 09/10/2017   HDL 49.00 09/10/2017   LDLCALC 70  09/10/2017   ALT 13 09/10/2017   AST 13 09/10/2017   NA 139 09/10/2017   K 3.5 09/10/2017   CL 103 09/10/2017   CREATININE 0.96 09/10/2017   BUN 11 09/10/2017   CO2 26 09/10/2017   TSH 2.35 09/10/2017    US Breast Ltd Uni Left Inc Axilla  Result Date: 11/27/2017 CLINICAL DATA:  Follow-up for probably benign left breast masses, 1 of which was previously biopsied in 2017 with pathology revealing fibrocystic changes. EXAM: DIGITAL DIAGNOSTIC BILATERAL MAMMOGRAM WITH CAD AND TOMO LEFT BREAST ULTRASOUND COMPARISON:  Previous exam(s). ACR Breast Density Category d: The breast tissue is extremely dense, which lowers the sensitivity of mammography. FINDINGS: No suspicious masses or calcifications are seen in either breast. The oval mass in the far upper posterior left breast with associated biopsy marking clip appears unchanged. There is no mammographic evidence of malignancy in either breast. Mammographic images were processed with CAD. Targeted ultrasound of the left breast was performed. The oval circumscribed hypoechoic mass in the left breast at 2 o'clock 9 cm from nipple measures 1.3 x 0.7 x 1.4 cm, unchanged to slightly smaller previously measuring 1.4 x 0.8 x 1.4 cm. The oval circumscribed hypoechoic mass in the left breast at 3 o'clock 3 cm from nipple measures 0.6 x 0.4 x 0.7 cm, also stable to slightly smaller previously measuring 0.8 x 0.4 x 0.8 cm. IMPRESSION: No findings of malignancy in either breast. RECOMMENDATION: Screening mammogram in one year.(Code:SM-B-01Y) I have discussed the findings and recommendations with the patient. Results were also provided in writing at the conclusion of the visit. If applicable, a reminder letter will be sent to the patient regarding the next appointment. BI-RADS CATEGORY  2: Benign. Electronically Signed   By: Edwin Cap M.D.   On: 11/27/2017 09:32   Mm Diag Breast Tomo Bilateral  Result Date: 11/27/2017 CLINICAL DATA:  Follow-up for probably benign  left breast masses, 1 of which was previously biopsied in 2017 with pathology revealing fibrocystic changes. EXAM: DIGITAL DIAGNOSTIC BILATERAL MAMMOGRAM WITH CAD AND TOMO LEFT BREAST ULTRASOUND COMPARISON:  Previous exam(s). ACR Breast Density Category d: The breast tissue is extremely dense, which lowers the sensitivity of mammography. FINDINGS: No suspicious masses or calcifications are seen in either breast. The oval mass in the far upper posterior left breast with associated biopsy marking clip appears unchanged. There is no mammographic evidence of malignancy in either breast. Mammographic images were processed with CAD. Targeted ultrasound of the left breast was performed. The oval circumscribed hypoechoic  mass in the left breast at 2 o'clock 9 cm from nipple measures 1.3 x 0.7 x 1.4 cm, unchanged to slightly smaller previously measuring 1.4 x 0.8 x 1.4 cm. The oval circumscribed hypoechoic mass in the left breast at 3 o'clock 3 cm from nipple measures 0.6 x 0.4 x 0.7 cm, also stable to slightly smaller previously measuring 0.8 x 0.4 x 0.8 cm. IMPRESSION: No findings of malignancy in either breast. RECOMMENDATION: Screening mammogram in one year.(Code:SM-B-01Y) I have discussed the findings and recommendations with the patient. Results were also provided in writing at the conclusion of the visit. If applicable, a reminder letter will be sent to the patient regarding the next appointment. BI-RADS CATEGORY  2: Benign. Electronically Signed   By: Edwin CapJennifer  Jarosz M.D.   On: 11/27/2017 09:32    Assessment & Plan:   There are no diagnoses linked to this encounter.   No orders of the defined types were placed in this encounter.    Follow-up: No follow-ups on file.  Sonda PrimesAlex Shavonn Convey, MD

## 2018-03-25 NOTE — Assessment & Plan Note (Addendum)
Increase Wellbutrin Continue

## 2018-04-03 DIAGNOSIS — N393 Stress incontinence (female) (male): Secondary | ICD-10-CM

## 2018-04-05 ENCOUNTER — Other Ambulatory Visit: Payer: Self-pay | Admitting: Internal Medicine

## 2018-04-05 MED ORDER — ALPRAZOLAM 1 MG PO TBDP: 1.0000 mg | ORAL_TABLET | Freq: Two times a day (BID) | ORAL | 1 refills | Status: DC | PRN

## 2018-04-29 ENCOUNTER — Ambulatory Visit: Payer: 59 | Admitting: Physical Therapy

## 2018-05-13 ENCOUNTER — Other Ambulatory Visit: Payer: Self-pay

## 2018-05-13 ENCOUNTER — Ambulatory Visit: Payer: 59 | Attending: Obstetrics & Gynecology | Admitting: Physical Therapy

## 2018-05-13 DIAGNOSIS — M6281 Muscle weakness (generalized): Secondary | ICD-10-CM | POA: Diagnosis present

## 2018-05-13 DIAGNOSIS — R279 Unspecified lack of coordination: Secondary | ICD-10-CM | POA: Diagnosis present

## 2018-05-13 NOTE — Therapy (Signed)
[facility] [address] [place], [place], [address] Phone: [phone]   Fax:  [phone]  Physical Therapy Evaluation  Patient Details  Name: Heather Patterson MRN: [id number] Date of Birth: 02-28-1976 Referring Provider (PT): Genia Del, MD   Encounter Date: 05/13/2018  PT End of Session - 05/13/18 1147    Visit Number  1    Date for PT Re-Evaluation  07/08/18    PT Start Time  1102    PT Stop Time  1145    PT Time Calculation (min)  43 min    Activity Tolerance  Patient tolerated treatment well    Behavior During Therapy  [facility] for tasks assessed/performed       No past medical history on file.  Past Surgical History:  Procedure Laterality Date  . APPENDECTOMY    . BREAST BIOPSY Left 2017   [facility]    There were no vitals filed for this visit.   Subjective Assessment - 05/13/18 1107    Subjective  Has started noticing leakage in the last 3 years when starting martial arts classes.  States it got progressively worse but currently leveled out.    Limitations  Other (comment)   kicking, jumping, cough, sneeze   Patient Stated Goals  be able to practice martial arts and jump on trampeline    Currently in Pain?  No/denies         [facility] PT Assessment - 05/13/18 0001      Assessment   Medical Diagnosis  N39.3 (ICD-10-CM) - SI (stress incontinence), female    Referring Provider (PT)  Genia Del, MD    Onset Date/Surgical Date  --   2-3   Prior Therapy  No      Precautions   Precautions  None      Restrictions   Weight Bearing Restrictions  No      Balance Screen   Has the patient fallen in the past 6 months  No      Home Environment   Living Environment  Private residence    Living Arrangements  Children   3 children     Prior Function   Level of Independence  Independent    Vocation  Full time employment      Cognition   Overall Cognitive Status  Within Functional Limits for  tasks assessed      Posture/Postural Control   Posture/Postural Control  No significant limitations      ROM / Strength   AROM / PROM / Strength  PROM;Strength      PROM   Overall PROM Comments  bilat hips WNL      Strength   Overall Strength Comments  bilat LE 5/5      Flexibility   Soft Tissue Assessment /Muscle Length  yes    Hamstrings  WNL      Ambulation/Gait   Gait Pattern  Within Functional Limits                Objective measurements completed on examination: See above findings.    Pelvic Floor Special Questions - 05/13/18 0001    Prior Pelvic/Prostate Exam  Yes    Date of Last Pelvic/Prostate Exam  --   3-4 months ago   Are you Pregnant or attempting pregnancy?  No    Prior Pregnancies  Yes    Number of Vaginal Deliveries  3    Any difficulty with labor and deliveries  Yes  2nd one face up   Currently Sexually Active  No    History of sexually transmitted disease  --   not for 6 months   Urinary Leakage  Yes    How often  several times a week; more if going to the gym    Pad use  when working out; busy days    Urinary urgency  No    Urinary frequency  2-3/day    Fluid intake  32    Caffeine beverages  coffee, tea am    Skin Integrity  Intact    Prolapse  Anterior Wall    Pelvic Floor Internal Exam  Pt identity confirmed and consent given to do internal assess and treatment of pelvic floor    Exam Type  Vaginal    Strength  weak squeeze, no lift    Strength # of seconds  5    Tone  low               PT Education - 05/13/18 1147    Education Details   Access Code: [facility] and neem pelvic educator    Person(s) Educated  Patient    Methods  Explanation;Demonstration;Handout;Verbal cues    Comprehension  Verbalized understanding;Returned demonstration       PT Short Term Goals - 05/13/18 1150      PT SHORT TERM GOAL #1   Title  Pt will be ind with initial HEP    Time  3    Period  Weeks    Status  New    Target Date   06/03/18      PT SHORT TERM GOAL #2   Title  Pt will be able to perform knack and report 75% less leakage when coughing    Time  4    Period  Weeks    Status  New    Target Date  06/10/18        PT Long Term Goals - 05/13/18 1151      PT LONG TERM GOAL #1   Title  Pt will report no leakage with coughing or sneezing    Time  8    Period  Weeks    Status  New    Target Date  07/08/18      PT LONG TERM GOAL #2   Title  Pt will be able to hold strong pelvic floor contraction for at least 10 seconds to work on building up endurnace    Time  8    Period  Weeks    Status  New    Target Date  07/08/18      PT LONG TERM GOAL #3   Title  Pt will be able to isolate pelvic floor muscle for improved muscle coordination when doing martial arts classes.    Time  8    Period  Weeks    Status  New    Target Date  07/08/18      PT LONG TERM GOAL #4   Title  Pt will report she can do 20 minute exercise that includes kicking without leaking    Time  8    Period  Weeks    Status  New    Target Date  07/08/18      PT LONG TERM GOAL #5   Title  Pt will be able to hold a 4/5 MMT contraction for at least 5 seconds in standing so she can return to jumping on the trampoline with her  kids    Time  8    Period  Weeks    Status  New    Target Date  07/08/18             Plan - 05/13/18 1508    Clinical Impression Statement  Pt is healthy 43 y/o female who has a very active lifestyle.  She has been noticing increased urinary leakage since beginning martial arts class over the last 3 years.  She is not currently sexually active but has in the past had leakage during intercourse.  Pt has 2/5 MMT with ability to hold with some resistance, but poor strength of closure of vaginal canal.  Some bladder prolapse occurse with bearing down, but she is able to correct this with kegel.  Pt has good LE strength of 5/5 bilateral throughout.  Hip ROM and pelvic alignment all normal.  Pt co-contracts hip  and thigh muscles when doing pelvic floor contraction.  Pt will benefit from skilled PT to address deficits so she can return to full activity level.    Personal Factors and Comorbidities  Comorbidity 1    Comorbidities  3 vaginal deliveries    Examination-Activity Limitations  Continence    Examination-Participation Restrictions  Other    Stability/Clinical Decision Making  Stable/Uncomplicated    Clinical Decision Making  Low    Rehab Potential  Excellent    PT Frequency  2x / week   reducing to 1x/week   PT Duration  8 weeks    PT Treatment/Interventions  ADLs/Self Care Home Management;Biofeedback;Therapeutic activities;Therapeutic exercise;Neuromuscular re-education;Patient/family education;Passive range of motion;Manual techniques;Taping;Dry needling    PT Next Visit Plan  biofeedback for eccentric contraction    PT Home Exercise Plan  Access Code: [facility]     Consulted and Agree with Plan of Care  Patient       Patient will benefit from skilled therapeutic intervention in order to improve the following deficits and impairments:  Pain, Decreased strength, Impaired tone, Decreased coordination  Visit Diagnosis: Muscle weakness (generalized)  Unspecified lack of coordination     Problem List Patient Active Problem List   Diagnosis Date Noted  . 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 Cerumen impaction 06/22/2014  . Otitis, externa, infective 06/22/2014  . Tail bone pain 03/27/2013  . Dyspnea 05/28/2012  . Tachycardia 05/28/2012  . Anemia 05/28/2012  . Well adult exam 03/20/2012    Junious Silk, PT 05/13/2018, 3:20 PM  [facility] [address] [place], [place], [address] Phone: [phone]   Fax:  [phone]  Name: Heather Patterson MRN: [id number] Date of Birth: Oct 10, 1975

## 2018-05-13 NOTE — Patient Instructions (Addendum)
   [facility] [address], [address] [place], [place] [address] Phone # [phone] Fax [phone]  

## 2018-05-16 ENCOUNTER — Other Ambulatory Visit: Payer: Self-pay

## 2018-05-16 ENCOUNTER — Ambulatory Visit: Payer: 59 | Admitting: Physical Therapy

## 2018-05-16 ENCOUNTER — Encounter: Payer: Self-pay | Admitting: Physical Therapy

## 2018-05-16 DIAGNOSIS — M6281 Muscle weakness (generalized): Secondary | ICD-10-CM

## 2018-05-16 DIAGNOSIS — R279 Unspecified lack of coordination: Secondary | ICD-10-CM

## 2018-05-16 NOTE — Patient Instructions (Signed)
Access Code: [facility]  URL: [email or web address]  Date: 05/16/2018  Prepared by: Dwana Curd   Exercises  Supine Pelvic Floor Contraction - 10 reps - 1 sets - 5 sec hold - 3x daily - 7x weekly  Sidelying Pelvic Floor Contraction with Hip Abduction - 10 reps - 1 sets - 1x daily - 7x weekly  Mini Squat with Pelvic Floor Contraction - 10 reps - 3 sets - 1x daily - 7x weekly

## 2018-05-16 NOTE — Therapy (Addendum)
[facility] [address] [place], [place], [address] Phone: [phone]   Fax:  [phone]  Physical Therapy Treatment  Patient Details  Name: Heather Patterson MRN: [id number] Date of Birth: 1975/03/06 Referring Provider (PT): [name], MD   Encounter Date: 05/16/2018  PT End of Session - 05/16/18 1700    Visit Number  2    Date for PT Re-Evaluation  07/08/18    PT Start Time  7673    PT Stop Time  1622    PT Time Calculation (min)  40 min    Activity Tolerance  Patient tolerated treatment well    Behavior During Therapy  [facility] for tasks assessed/performed       History reviewed. No pertinent past medical history.  Past Surgical History:  Procedure Laterality Date  . APPENDECTOMY    . BREAST BIOPSY Left 2017   [facility]    There were no vitals filed for this visit.  Subjective Assessment - 05/16/18 1545    Subjective  Pt has not had time to do the exercises yet    Currently in Pain?  No/denies                    Pelvic Floor Special Questions - 05/16/18 0001    Biofeedback  standing staggared stance; mini squats with eccentric lowering - both 2 sec hold - 4 sec relax        [facility] Adult PT Treatment/Exercise - 05/16/18 0001      Neuro Re-ed    Neuro Re-ed Details   tactile cues to engage circular contraction and left levators             PT Education - 05/16/18 1715    Education Details   Access Code: [facility]     Person(s) Educated  Patient    Methods  Explanation;Demonstration;Handout;Verbal cues    Comprehension  Verbalized understanding;Returned demonstration       PT Short Term Goals - 05/16/18 1708      PT SHORT TERM GOAL #1   Title  Pt will be ind with initial HEP    Status  On-going      PT SHORT TERM GOAL #2   Title  Pt will be able to perform knack and report 75% less leakage when coughing    Status  On-going        PT Long Term Goals - 05/13/18 1151       PT LONG TERM GOAL #1   Title  Pt will report no leakage with coughing or sneezing    Time  8    Period  Weeks    Status  New    Target Date  07/08/18      PT LONG TERM GOAL #2   Title  Pt will be able to hold strong pelvic floor contraction for at least 10 seconds to work on building up endurnace    Time  8    Period  Weeks    Status  New    Target Date  07/08/18      PT LONG TERM GOAL #3   Title  Pt will be able to isolate pelvic floor muscle for improved muscle coordination when doing martial arts classes.    Time  8    Period  Weeks    Status  New    Target Date  07/08/18      PT LONG TERM GOAL #4   Title  Pt will report she can do 20 minute exercise that includes kicking without leaking    Time  8    Period  Weeks    Status  New    Target Date  07/08/18      PT LONG TERM GOAL #5   Title  Pt will be able to hold a 4/5 MMT contraction for at least 5 seconds in standing so she can return to jumping on the trampoline with her kids    Time  8    Period  Weeks    Status  New    Target Date  07/08/18            Plan - 05/16/18 1704    Clinical Impression Statement  Pt has difficulty distinguishing when she is contracting pelvic floor completely.  she is able to sense the contraction, but feels like she is not getting a deep contraction.  Upon palpation she is not engaging the pelvic floor on the left as much .  Pt was able to activate those muscles with tactile cues.  She is able to hold that contraction for 2-3 seconds and then release.  She will benefit from skilled PT to continue working on strengthening and endurance of pelvic floor.    PT Treatment/Interventions  ADLs/Self Care Home Management;Biofeedback;Therapeutic activities;Therapeutic exercise;Neuromuscular re-education;Patient/family education;Passive range of motion;Manual techniques;Taping;Dry needling    PT Next Visit Plan  tactile cues and hold against restitance, training with biofeedback, bridge with  ball    PT Home Exercise Plan  Access Code: [facility]     Consulted and Agree with Plan of Care  Patient       Patient will benefit from skilled therapeutic intervention in order to improve the following deficits and impairments:  Pain, Decreased strength, Impaired tone, Decreased coordination  Visit Diagnosis: Muscle weakness (generalized)  Unspecified lack of coordination     Problem List Patient Active Problem List   Diagnosis Date Noted  . 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 Cerumen impaction 06/22/2014  . Otitis, externa, infective 06/22/2014  . Tail bone pain 03/27/2013  . Dyspnea 05/28/2012  . Tachycardia 05/28/2012  . Anemia 05/28/2012  . Well adult exam 03/20/2012    Heather Patterson, PT 05/16/2018, 5:15 PM  [facility] [address], [place], [place], [address] Phone: [phone]   Fax:  [phone]  Name: Heather Patterson MRN: [id number] Date of Birth: 1975/12/17  PHYSICAL THERAPY DISCHARGE SUMMARY  Visits from Start of Care: 2  Current functional level related to goals / functional outcomes:   see above notes Remaining deficits: See above   Education / Equipment:  HEP Plan: Patient agrees to discharge.  Patient goals were not met. Patient is being discharged due to not returning since the last visit.  ?????     [employer], PT 07/29/18 5:24 PM

## 2018-05-24 ENCOUNTER — Encounter: Payer: Self-pay | Admitting: Physical Therapy

## 2018-05-27 ENCOUNTER — Encounter: Payer: Self-pay | Admitting: Physical Therapy

## 2018-07-17 MED ORDER — IRON POLYSACCH CMPLX-B12-FA 150-0.025-1 MG PO CAPS: 1.0000 | ORAL_CAPSULE | Freq: Every day | ORAL | 2 refills | Status: DC

## 2018-07-17 MED ORDER — ESCITALOPRAM OXALATE 20 MG PO TABS: 20.0000 mg | ORAL_TABLET | Freq: Every day | ORAL | 2 refills | Status: DC

## 2018-07-17 MED ORDER — BUPROPION HCL ER (XL) 300 MG PO TB24: 300.0000 mg | ORAL_TABLET | Freq: Every day | ORAL | 2 refills | Status: DC

## 2018-07-23 MED FILL — buPROPion HCL ER (XL) 300 M: 300 | 90 days supply | Qty: 90 | Fill #0

## 2018-07-23 MED FILL — ESCITALOPRAM 20 MG TABLET: 20 | 90 days supply | Qty: 90 | Fill #0

## 2018-09-16 ENCOUNTER — Ambulatory Visit: Payer: 59 | Admitting: Internal Medicine

## 2018-09-18 ENCOUNTER — Other Ambulatory Visit: Payer: Self-pay

## 2018-09-18 ENCOUNTER — Encounter: Payer: Self-pay | Admitting: Internal Medicine

## 2018-09-18 ENCOUNTER — Ambulatory Visit (INDEPENDENT_AMBULATORY_CARE_PROVIDER_SITE_OTHER): Payer: No Typology Code available for payment source | Admitting: Internal Medicine

## 2018-09-18 VITALS — BP 120/84 | HR 81 | Temp 98.3°F | Ht 68.0 in | Wt 157.0 lb

## 2018-09-18 DIAGNOSIS — R000 Tachycardia, unspecified: Secondary | ICD-10-CM

## 2018-09-18 DIAGNOSIS — R635 Abnormal weight gain: Secondary | ICD-10-CM | POA: Diagnosis not present

## 2018-09-18 DIAGNOSIS — Z0000 Encounter for general adult medical examination without abnormal findings: Secondary | ICD-10-CM | POA: Diagnosis not present

## 2018-09-18 DIAGNOSIS — F4323 Adjustment disorder with mixed anxiety and depressed mood: Secondary | ICD-10-CM | POA: Diagnosis not present

## 2018-09-18 MED ORDER — B COMPLEX PO TABS: 1.0000 | ORAL_TABLET | Freq: Every day | ORAL | 3 refills | Status: DC

## 2018-09-18 MED ORDER — VORTIOXETINE HBR 10 MG PO TABS: 10.0000 mg | ORAL_TABLET | Freq: Every day | ORAL | 5 refills | Status: DC

## 2018-09-18 MED ORDER — CARIPRAZINE HCL 1.5 MG PO CAPS: 1.5000 mg | ORAL_CAPSULE | Freq: Every day | ORAL | 5 refills | Status: DC

## 2018-09-18 NOTE — Assessment & Plan Note (Addendum)
Worse  Severe refractory depression Continue with counseling Vraylar added at 1.5 mg daily.  Risks and benefits discussed Discontinue Lexapro and start Trintellix Psychiatry referral was advised Continue with PRN Xanax

## 2018-09-18 NOTE — Patient Instructions (Addendum)
These suggestions will probably help you to improve your metabolism if you are not overweight and to lose weight if you are overweight: 1.  Reduce your consumption of sugars and starches.  Eliminate high fructose corn syrup from your diet.  Reduce your consumption of processed foods.  For desserts try to have seasonal fruits, berries with with green, nuts, cheeses or dark chocolate with more than 70% cacao. 2.  Do not snack 3.  You do not have to eat breakfast.  If you choose to have breakfast-eat plain greek yogurt, eggs, oatmeal (without sugar) 4.  Drink water, freshly brewed unsweetened tea (green, black or herbal) or coffee.  Do not drink sodas including diet sodas , juices, beverages sweetened with artificial sweeteners. 5.  Reduce your consumption of refined grains. 6.  Avoid protein drinks such as Optifast, Slim fast etc. Eat chicken, fish, meat, dairy and beans for your sources of protein 7.  Natural unprocessed fats like cold pressed virgin olive oil, butter, coconut oil are good for you.  Eat avocados 8.  Increase your consumption of fiber.  Fruits, berries, vegetables, whole grains, flaxseeds, Chia seeds, beans, popcorn, nuts, oatmeal are good sources of fiber 9.  Use vinegar in your diet, i.e. apple cider vinegar, red wine or balsamic vinegar 10.  You can try fasting.  For example you can skip breakfast and lunch every other day (24-hour fast) 11.  Stress reduction, good night sleep, relaxation, meditation, yoga and other physical activity is likely to help you to maintain low weight too. 12.  If you drink alcohol, limit your alcohol intake to no more than 2 drinks a day.   Mediterranean diet is good for you. ([employer] has a typical Mediterranean cuisine menu) The Mediterranean diet is a way of eating based on the traditional cuisine of countries bordering the [place] Sea. While there is no single definition of the Mediterranean diet, it is typically high in vegetables,  fruits, whole grains, beans, nut and seeds, and olive oil. The main components of Mediterranean diet include: . Daily consumption of vegetables, fruits, whole grains and healthy fats  . Weekly intake of fish, poultry, beans and eggs  . Moderate portions of dairy products  . Limited intake of red meat Other important elements of the Mediterranean diet are sharing meals with family and friends, enjoying a glass of red wine and being physically active. Health benefits of a Mediterranean diet: A traditional Mediterranean diet consisting of large quantities of fresh fruits and vegetables, nuts, fish and olive oil-coupled with physical activity-can reduce your risk of serious mental and physical health problems by: Preventing heart disease and strokes. Following a Mediterranean diet limits your intake of refined breads, processed foods, and red meat, and encourages drinking red wine instead of hard liquor-all factors that can help prevent heart disease and stroke. Keeping you agile. If you're an older adult, the nutrients gained with a Mediterranean diet may reduce your risk of developing muscle weakness and other signs of frailty by about 70 percent. Reducing the risk of Alzheimer's. Research suggests that the Mediterranean diet may improve cholesterol, blood sugar levels, and overall blood vessel health, which in turn may reduce your risk of Alzheimer's disease or dementia. Halving the risk of Parkinson's disease. The high levels of antioxidants in the Mediterranean diet can prevent cells from undergoing a damaging process called oxidative stress, thereby cutting the risk of Parkinson's disease in half. Increasing longevity. By reducing your risk of developing heart disease or cancer with   the Mediterranean diet, you're reducing your risk of death at any age by 20%. Protecting against type 2 diabetes. A Mediterranean diet is rich in fiber which digests slowly, prevents huge swings in blood sugar, and can  help you maintain a healthy weight.    Cabbage soup recipe that will not make you gain weight: Take 1 small head of cabbage, 1 average pack of celery, 4 green peppers, 4 onions, 2 cans diced tomatoes (they are not available without salt), salt and spices to taste.  Chop cabbage, celery, peppers and onions.  And tomatoes and 2-2.5 liters (2.5 quarts) of water so that it would just cover the vegetables.  Bring to boil.  Add spices and salt.  Turn heat to low/medium and simmer for 20-25 minutes.  Naturally, you can make a smaller batch and change some of the ingredients.  

## 2018-09-18 NOTE — Progress Notes (Signed)
Subjective:  Patient ID: Heather Patterson, female    DOB: 08-Apr-1975  Age: 43 y.o. MRN: [id number]  CC: No chief complaint on file.   HPI Heather Patterson presents for depression -she is worse.  She is complaining of excessive sleepiness at night.  She has been anxious with racing thoughts, inability to concentrate, depressed mood all the time.  The medicines do not seem to work.  She is in the final stages through her divorce process.  She is not suicidal or homicidal.  She has gained a little weight She has been seeing her therapist regular Separated since 7/19. [facility] - has  1 more year  Outpatient Medications Prior to Visit  Medication Sig Dispense Refill  . ALPRAZolam (NIRAVAM) 1 MG dissolvable tablet Take 1 tablet (1 mg total) by mouth 2 (two) times daily as needed for anxiety. 60 tablet 1  . buPROPion (WELLBUTRIN XL) 300 MG 24 hr tablet Take 1 tablet (300 mg total) by mouth daily. 90 tablet 2  . cholecalciferol (VITAMIN D3) 25 MCG (1000 UT) tablet Take 1,000 Units by mouth daily.    Marland Kitchen. escitalopram (LEXAPRO) 20 MG tablet Take 1 tablet (20 mg total) by mouth daily. 90 tablet 2  . Iron Polysacch Cmplx-B12-FA 150-0.025-1 MG CAPS Take 1 capsule by mouth daily. 90 each 2  . ergocalciferol (VITAMIN D2) 50000 UNITS capsule Take 1 capsule (50,000 Units total) by mouth once a week. (Patient not taking: Reported on 09/18/2018) 6 capsule 0   No facility-administered medications prior to visit.     ROS: Review of Systems  Constitutional: Positive for fatigue and unexpected weight change. Negative for activity change, appetite change and chills.  HENT: Negative for congestion, mouth sores and sinus pressure.   Eyes: Negative for visual disturbance.  Respiratory: Negative for cough and chest tightness.   Gastrointestinal: Negative for abdominal pain and nausea.  Genitourinary: Negative for difficulty urinating, frequency and vaginal pain.  Musculoskeletal: Negative for back pain  and gait problem.  Skin: Negative for pallor and rash.  Neurological: Negative for dizziness, tremors, weakness, numbness and headaches.  Psychiatric/Behavioral: Positive for decreased concentration, dysphoric mood and sleep disturbance. Negative for agitation, behavioral problems, confusion, hallucinations, self-injury and suicidal ideas. The patient is nervous/anxious. The patient is not hyperactive.     Objective:  BP 120/84 (BP Location: Left Arm)   Pulse 81   Temp 98.3 F (36.8 C) (Oral)   Ht 5\' 8"  (1.727 m)   Wt 157 lb (71.2 kg)   SpO2 98%   BMI 23.87 kg/m   BP Readings from Last 3 Encounters:  09/18/18 120/84  03/25/18 126/82  12/31/17 112/74    Wt Readings from Last 3 Encounters:  09/18/18 157 lb (71.2 kg)  03/25/18 137 lb (62.1 kg)  12/31/17 131 lb (59.4 kg)    Physical Exam Constitutional:      General: She is not in acute distress.    Appearance: She is well-developed. She is obese.  HENT:     Head: Normocephalic.     Right Ear: External ear normal.     Left Ear: External ear normal.     Nose: Nose normal.  Eyes:     General:        Right eye: No discharge.        Left eye: No discharge.     Conjunctiva/sclera: Conjunctivae normal.     Pupils: Pupils are equal, round, and reactive to light.  Neck:  Musculoskeletal: Normal range of motion and neck supple.     Thyroid: No thyromegaly.     Vascular: No JVD.     Trachea: No tracheal deviation.  Cardiovascular:     Rate and Rhythm: Normal rate and regular rhythm.     Heart sounds: Normal heart sounds.  Pulmonary:     Effort: No respiratory distress.     Breath sounds: No stridor. No wheezing.  Abdominal:     General: Bowel sounds are normal. There is no distension.     Palpations: Abdomen is soft. There is no mass.     Tenderness: There is no abdominal tenderness. There is no guarding or rebound.  Musculoskeletal:        General: No tenderness.  Lymphadenopathy:     Cervical: No cervical  adenopathy.  Skin:    Findings: No erythema or rash.  Neurological:     Mental Status: She is oriented to person, place, and time.     Cranial Nerves: No cranial nerve deficit.     Motor: No abnormal muscle tone.     Coordination: Coordination normal.     Deep Tendon Reflexes: Reflexes normal.  Psychiatric:        Behavior: Behavior normal.        Thought Content: Thought content normal.        Judgment: Judgment normal.   The patient is depressed and tearful.  Lab Results  Component Value Date   WBC 5.4 09/10/2017   HGB 13.5 09/10/2017   HCT 40.5 09/10/2017   PLT 240.0 09/10/2017   GLUCOSE 106 (H) 09/10/2017   CHOL 137 09/10/2017   TRIG 91.0 09/10/2017   HDL 49.00 09/10/2017   LDLCALC 70 09/10/2017   ALT 13 09/10/2017   AST 13 09/10/2017   NA 139 09/10/2017   K 3.5 09/10/2017   CL 103 09/10/2017   CREATININE 0.96 09/10/2017   BUN 11 09/10/2017   CO2 26 09/10/2017   TSH 2.35 09/10/2017    [place] Breast Ltd Uni Left Inc Axilla  Result Date: 11/27/2017 CLINICAL DATA:  Follow-up for probably benign left breast masses, 1 of which was previously biopsied in 2017 with pathology revealing fibrocystic changes. EXAM: DIGITAL DIAGNOSTIC BILATERAL MAMMOGRAM WITH CAD AND TOMO LEFT BREAST ULTRASOUND COMPARISON:  Previous exam(s). ACR Breast Density Category d: The breast tissue is extremely dense, which lowers the sensitivity of mammography. FINDINGS: No suspicious masses or calcifications are seen in either breast. The oval mass in the far upper posterior left breast with associated biopsy marking clip appears unchanged. There is no mammographic evidence of malignancy in either breast. Mammographic images were processed with CAD. Targeted ultrasound of the left breast was performed. The oval circumscribed hypoechoic mass in the left breast at 2 o'clock 9 cm from nipple measures 1.3 x 0.7 x 1.4 cm, unchanged to slightly smaller previously measuring 1.4 x 0.8 x 1.4 cm. The oval circumscribed  hypoechoic mass in the left breast at 3 o'clock 3 cm from nipple measures 0.6 x 0.4 x 0.7 cm, also stable to slightly smaller previously measuring 0.8 x 0.4 x 0.8 cm. IMPRESSION: No findings of malignancy in either breast. RECOMMENDATION: Screening mammogram in one year.(Code:SM-B-01Y) I have discussed the findings and recommendations with the patient. Results were also provided in writing at the conclusion of the visit. If applicable, a reminder letter will be sent to the patient regarding the next appointment. BI-RADS CATEGORY  2: Benign. Electronically Signed   By: Coralyn MarkJennifer  Jarosz M.D.  On: 11/27/2017 09:32   Mm Diag Breast Tomo Bilateral  Result Date: 11/27/2017 CLINICAL DATA:  Follow-up for probably benign left breast masses, 1 of which was previously biopsied in 2017 with pathology revealing fibrocystic changes. EXAM: DIGITAL DIAGNOSTIC BILATERAL MAMMOGRAM WITH CAD AND TOMO LEFT BREAST ULTRASOUND COMPARISON:  Previous exam(s). ACR Breast Density Category d: The breast tissue is extremely dense, which lowers the sensitivity of mammography. FINDINGS: No suspicious masses or calcifications are seen in either breast. The oval mass in the far upper posterior left breast with associated biopsy marking clip appears unchanged. There is no mammographic evidence of malignancy in either breast. Mammographic images were processed with CAD. Targeted ultrasound of the left breast was performed. The oval circumscribed hypoechoic mass in the left breast at 2 o'clock 9 cm from nipple measures 1.3 x 0.7 x 1.4 cm, unchanged to slightly smaller previously measuring 1.4 x 0.8 x 1.4 cm. The oval circumscribed hypoechoic mass in the left breast at 3 o'clock 3 cm from nipple measures 0.6 x 0.4 x 0.7 cm, also stable to slightly smaller previously measuring 0.8 x 0.4 x 0.8 cm. IMPRESSION: No findings of malignancy in either breast. RECOMMENDATION: Screening mammogram in one year.(Code:SM-B-01Y) I have discussed the findings and  recommendations with the patient. Results were also provided in writing at the conclusion of the visit. If applicable, a reminder letter will be sent to the patient regarding the next appointment. BI-RADS CATEGORY  2: Benign. Electronically Signed   By: Everlean Alstrom M.D.   On: 11/27/2017 09:32    Assessment & Plan:   There are no diagnoses linked to this encounter.   No orders of the defined types were placed in this encounter.    Follow-up: No follow-ups on file.  Walker Kehr, MD

## 2018-09-23 ENCOUNTER — Encounter: Payer: Self-pay | Admitting: Internal Medicine

## 2018-09-23 DIAGNOSIS — R635 Abnormal weight gain: Secondary | ICD-10-CM | POA: Insufficient documentation

## 2018-09-23 NOTE — Assessment & Plan Note (Signed)
Heart rate has remained stable

## 2018-09-23 NOTE — Assessment & Plan Note (Signed)
Due to depression  Vraylar added at 1.5 mg daily.  Risks and benefits discussed Discontinue Lexapro and start Trintellix Diet discussed

## 2018-09-26 MED FILL — TRINTELLIX 10 MG TABLET: 10 | 30 days supply | Qty: 30 | Fill #0

## 2018-09-26 MED FILL — VRAYLAR 1.5 MG CAPSULE: 1.5 | 30 days supply | Qty: 30 | Fill #0

## 2018-10-03 ENCOUNTER — Encounter: Payer: Self-pay | Admitting: Internal Medicine

## 2018-10-03 ENCOUNTER — Other Ambulatory Visit: Payer: No Typology Code available for payment source

## 2018-10-03 ENCOUNTER — Other Ambulatory Visit (INDEPENDENT_AMBULATORY_CARE_PROVIDER_SITE_OTHER): Payer: No Typology Code available for payment source

## 2018-10-03 ENCOUNTER — Ambulatory Visit (INDEPENDENT_AMBULATORY_CARE_PROVIDER_SITE_OTHER): Payer: No Typology Code available for payment source | Admitting: Internal Medicine

## 2018-10-03 ENCOUNTER — Other Ambulatory Visit: Payer: Self-pay

## 2018-10-03 VITALS — BP 124/88 | HR 96 | Temp 98.2°F | Ht 68.0 in | Wt 162.0 lb

## 2018-10-03 DIAGNOSIS — M25561 Pain in right knee: Secondary | ICD-10-CM

## 2018-10-03 DIAGNOSIS — F5104 Psychophysiologic insomnia: Secondary | ICD-10-CM

## 2018-10-03 DIAGNOSIS — M25562 Pain in left knee: Secondary | ICD-10-CM

## 2018-10-03 DIAGNOSIS — R635 Abnormal weight gain: Secondary | ICD-10-CM | POA: Diagnosis not present

## 2018-10-03 DIAGNOSIS — F4323 Adjustment disorder with mixed anxiety and depressed mood: Secondary | ICD-10-CM

## 2018-10-03 DIAGNOSIS — Z111 Encounter for screening for respiratory tuberculosis: Secondary | ICD-10-CM | POA: Diagnosis not present

## 2018-10-03 DIAGNOSIS — Z0000 Encounter for general adult medical examination without abnormal findings: Secondary | ICD-10-CM | POA: Diagnosis not present

## 2018-10-03 DIAGNOSIS — Z758 Other problems related to medical facilities and other health care: Secondary | ICD-10-CM

## 2018-10-03 LAB — LIPID PANEL
Cholesterol: 166 mg/dL (ref 0–200)
HDL: 66.9 mg/dL (ref >39.00)
LDL Cholesterol: 85 mg/dL (ref 0–99)
NonHDL: 98.6
Total CHOL/HDL Ratio: 2
Triglycerides: 70 mg/dL (ref 0.0–149.0)
VLDL: 14 mg/dL (ref 0.0–40.0)

## 2018-10-03 LAB — URINALYSIS
Bilirubin Urine: NEGATIVE
Hgb urine dipstick: NEGATIVE
Ketones, ur: NEGATIVE
Leukocytes,Ua: NEGATIVE
Nitrite: NEGATIVE
Specific Gravity, Urine: 1.02 (ref 1.000–1.030)
Total Protein, Urine: NEGATIVE
Urine Glucose: NEGATIVE
Urobilinogen, UA: 0.2 (ref 0.0–1.0)
pH: 7 (ref 5.0–8.0)

## 2018-10-03 LAB — CBC WITH DIFFERENTIAL/PLATELET
Basophils Absolute: 0 10*3/uL (ref 0.0–0.1)
Basophils Relative: 0.9 % (ref 0.0–3.0)
Eosinophils Absolute: 0.2 10*3/uL (ref 0.0–0.7)
Eosinophils Relative: 2.9 % (ref 0.0–5.0)
HCT: 39.8 % (ref 36.0–46.0)
Hemoglobin: 13.2 g/dL (ref 12.0–15.0)
Lymphocytes Relative: 30.9 % (ref 12.0–46.0)
Lymphs Abs: 1.7 10*3/uL (ref 0.7–4.0)
MCHC: 33.2 g/dL (ref 30.0–36.0)
MCV: 84.8 fl (ref 78.0–100.0)
Monocytes Absolute: 0.7 10*3/uL (ref 0.1–1.0)
Monocytes Relative: 13.2 % — ABNORMAL HIGH (ref 3.0–12.0)
Neutro Abs: 2.9 10*3/uL (ref 1.4–7.7)
Neutrophils Relative %: 52.1 % (ref 43.0–77.0)
Platelets: 269 10*3/uL (ref 150.0–400.0)
RBC: 4.7 Mil/uL (ref 3.87–5.11)
RDW: 14.9 % (ref 11.5–15.5)
WBC: 5.5 10*3/uL (ref 4.0–10.5)

## 2018-10-03 LAB — BASIC METABOLIC PANEL
BUN: 17 mg/dL (ref 6–23)
CO2: 25 mEq/L (ref 19–32)
Calcium: 9.2 mg/dL (ref 8.4–10.5)
Chloride: 107 mEq/L (ref 96–112)
Creatinine, Ser: 0.98 mg/dL (ref 0.40–1.20)
GFR: 61.9 mL/min (ref >60.00)
Glucose, Bld: 102 mg/dL — ABNORMAL HIGH (ref 70–99)
Potassium: 4.1 mEq/L (ref 3.5–5.1)
Sodium: 140 mEq/L (ref 135–145)

## 2018-10-03 LAB — HEPATIC FUNCTION PANEL
ALT: 24 U/L (ref 0–35)
AST: 23 U/L (ref 0–37)
Albumin: 4.2 g/dL (ref 3.5–5.2)
Alkaline Phosphatase: 28 U/L — ABNORMAL LOW (ref 39–117)
Bilirubin, Direct: 0.1 mg/dL (ref 0.0–0.3)
Total Bilirubin: 0.3 mg/dL (ref 0.2–1.2)
Total Protein: 7 g/dL (ref 6.0–8.3)

## 2018-10-03 LAB — VITAMIN B12: Vitamin B-12: 857 pg/mL (ref 211–911)

## 2018-10-03 LAB — VITAMIN D 25 HYDROXY (VIT D DEFICIENCY, FRACTURES): VITD: 34.18 ng/mL (ref 30.00–100.00)

## 2018-10-03 LAB — TSH: TSH: 2.57 u[IU]/mL (ref 0.35–4.50)

## 2018-10-03 NOTE — Patient Instructions (Signed)
Patellofemoral Pain Syndrome  Patellofemoral pain syndrome is a condition in which the tissue (cartilage) on the underside of the kneecap (patella) softens or breaks down. This causes pain in the front of the knee. The condition is also called runner's knee or chondromalacia patella. Patellofemoral pain syndrome is most common in young adults who are active in sports. The knee is the largest joint in the body. The patella covers the front of the knee and is attached to muscles above and below the knee. The underside of the patella is covered with a smooth type of cartilage (synovium). The smooth surface helps the patella to glide easily when you move your knee. Patellofemoral pain syndrome causes swelling in the joint linings and bone surfaces in the knee. What are the causes? This condition may be caused by:  Overuse of the knee.  Poor alignment of your knee joints.  Weak leg muscles.  A direct blow to your kneecap. What increases the risk? You are more likely to develop this condition if:  You do a lot of activities that can wear down your kneecap. These include: ? Running. ? Squatting. ? Climbing stairs.  You start a new physical activity or exercise program.  You wear shoes that do not fit well.  You do not have good leg strength.  You are overweight. What are the signs or symptoms? The main symptom of this condition is knee pain. This may feel like a dull, aching pain underneath your patella, in the front of your knee. There may be a popping or cracking sound when you move your knee. Pain may get worse with:  Exercise.  Climbing stairs.  Running.  Jumping.  Squatting.  Kneeling.  Sitting for a long time.  Moving or pushing on your patella. How is this diagnosed? This condition may be diagnosed based on:  Your symptoms and medical history. You may be asked about your recent physical activities and which ones cause knee pain.  A physical exam. This may include:  ? Moving your patella back and forth. ? Checking your range of knee motion. ? Having you squat or jump to see if you have pain. ? Checking the strength of your leg muscles.  Imaging tests to confirm the diagnosis. These may include an MRI of your knee. How is this treated? This condition may be treated at home with rest, ice, compression, and elevation (RICE).  Other treatments may include:  Nonsteroidal anti-inflammatory drugs (NSAIDs).  Physical therapy to stretch and strengthen your leg muscles.  Shoe inserts (orthotics) to take stress off your knee.  A knee brace or knee support.  Adhesive tapes to the skin.  Surgery to remove damaged cartilage or move the patella to a better position. This is rare. Follow these instructions at home: If you have a shoe or brace:  Wear the shoe or brace as told by your health care provider. Remove it only as told by your health care provider.  Loosen the shoe or brace if your toes tingle, become numb, or turn cold and blue.  Keep the shoe or brace clean.  If the shoe or brace is not waterproof: ? Do not let it get wet. ? Cover it with a watertight covering when you take a bath or a shower. Managing pain, stiffness, and swelling  If directed, put ice on the painful area. ? If you have a removable shoe or brace, remove it as told by your health care provider. ? Put ice in a plastic bag. ?   Place a towel between your skin and the bag. ? Leave the ice on for 20 minutes, 2-3 times a day.  Move your toes often to avoid stiffness and to lessen swelling.  Rest your knee: ? Avoid activities that cause knee pain. ? When sitting or lying down, raise (elevate) the injured area above the level of your heart, whenever possible. General instructions  Take over-the-counter and prescription medicines only as told by your health care provider.  Use splints, braces, knee supports, or walking aids as directed by your health care provider.  Perform  stretching and strengthening exercises as told by your health care provider or physical therapist.  Do not use any products that contain nicotine or tobacco, such as cigarettes and e-cigarettes. These can delay healing. If you need help quitting, ask your health care provider.  Return to your normal activities as told by your health care provider. Ask your health care provider what activities are safe for you.  Keep all follow-up visits as told by your health care provider. This is important. Contact a health care provider if:  Your symptoms get worse.  You are not improving with home care. Summary  Patellofemoral pain syndrome is a condition in which the tissue (cartilage) on the underside of the kneecap (patella) softens or breaks down.  This condition causes swelling in the joint linings and bone surfaces in the knee. This leads to pain in the front of the knee.  This condition may be treated at home with rest, ice, compression, and elevation (RICE).  Use splints, braces, knee supports, or walking aids as directed by your health care provider. This information is not intended to replace advice given to you by your health care provider. Make sure you discuss any questions you have with your health care provider. Document Released: 02/01/2009 Document Revised: 03/26/2017 Document Reviewed: 03/26/2017 Elsevier Patient Education  2020 [employer]  

## 2018-10-03 NOTE — Progress Notes (Signed)
Subjective:  Patient ID: Heather Patterson, female    DOB: December 25, 1975  Age: 43 y.o. MRN: [id number]  CC: Annual Exam   HPI Heather GuOlga N Buikema presents for severe depression, anxiety, insomnia follow-up.  She says she is feeling better on the meds.  They are affordable with manufacturers coupons.  She is concerned about her weight gain.  Complains of knee pain bilaterally  Outpatient Medications Prior to Visit  Medication Sig Dispense Refill  . ALPRAZolam (NIRAVAM) 1 MG dissolvable tablet Take 1 tablet (1 mg total) by mouth 2 (two) times daily as needed for anxiety. 60 tablet 1  . b complex vitamins tablet Take 1 tablet by mouth daily. 100 tablet 3  . buPROPion (WELLBUTRIN XL) 300 MG 24 hr tablet Take 1 tablet (300 mg total) by mouth daily. 90 tablet 2  . cariprazine (VRAYLAR) capsule Take 1 capsule (1.5 mg total) by mouth daily. 30 capsule 5  . cholecalciferol (VITAMIN D3) 25 MCG (1000 UT) tablet Take 1,000 Units by mouth daily.    . Iron Polysacch Cmplx-B12-FA 150-0.025-1 MG CAPS Take 1 capsule by mouth daily. 90 each 2  . vortioxetine HBr (TRINTELLIX) 10 MG TABS tablet Take 1 tablet (10 mg total) by mouth daily. 30 tablet 5   No facility-administered medications prior to visit.     ROS: Review of Systems  Constitutional: Negative for activity change, appetite change, chills, fatigue and unexpected weight change.  HENT: Negative for congestion, mouth sores and sinus pressure.   Eyes: Negative for visual disturbance.  Respiratory: Negative for cough and chest tightness.   Gastrointestinal: Negative for abdominal pain and nausea.  Genitourinary: Negative for difficulty urinating, frequency and vaginal pain.  Musculoskeletal: Negative for back pain and gait problem.  Skin: Negative for pallor and rash.  Neurological: Negative for dizziness, tremors, weakness, numbness and headaches.  Psychiatric/Behavioral: Positive for dysphoric mood. Negative for confusion, sleep disturbance and suicidal  ideas. The patient is nervous/anxious.     Objective:  BP 124/88 (BP Location: Left Arm, Patient Position: Sitting, Cuff Size: Normal)   Pulse 96   Temp 98.2 F (36.8 C) (Temporal)   Ht 5\' 8"  (1.727 m)   Wt 162 lb (73.5 kg)   SpO2 98%   BMI 24.63 kg/m   BP Readings from Last 3 Encounters:  10/03/18 124/88  09/18/18 120/84  03/25/18 126/82    Wt Readings from Last 3 Encounters:  10/03/18 162 lb (73.5 kg)  09/18/18 157 lb (71.2 kg)  03/25/18 137 lb (62.1 kg)    Physical Exam Constitutional:      General: She is not in acute distress.    Appearance: She is well-developed.  HENT:     Head: Normocephalic.     Right Ear: External ear normal.     Left Ear: External ear normal.     Nose: Nose normal.  Eyes:     General:        Right eye: No discharge.        Left eye: No discharge.     Conjunctiva/sclera: Conjunctivae normal.     Pupils: Pupils are equal, round, and reactive to light.  Neck:     Musculoskeletal: Normal range of motion and neck supple.     Thyroid: No thyromegaly.     Vascular: No JVD.     Trachea: No tracheal deviation.  Cardiovascular:     Rate and Rhythm: Normal rate and regular rhythm.     Heart sounds: Normal heart sounds.  Pulmonary:  Effort: No respiratory distress.     Breath sounds: No stridor. No wheezing.  Abdominal:     General: Bowel sounds are normal. There is no distension.     Palpations: Abdomen is soft. There is no mass.     Tenderness: There is no abdominal tenderness. There is no guarding or rebound.  Musculoskeletal:        General: No tenderness.  Lymphadenopathy:     Cervical: No cervical adenopathy.  Skin:    Findings: No erythema or rash.  Neurological:     Cranial Nerves: No cranial nerve deficit.     Motor: No abnormal muscle tone.     Coordination: Coordination normal.     Deep Tendon Reflexes: Reflexes normal.  Psychiatric:        Behavior: Behavior normal.        Thought Content: Thought content normal.         Judgment: Judgment normal.   She is in a much better mood.  Not tearful  Lab Results  Component Value Date   WBC 5.5 10/03/2018   HGB 13.2 10/03/2018   HCT 39.8 10/03/2018   PLT 269.0 10/03/2018   GLUCOSE 102 (H) 10/03/2018   CHOL 166 10/03/2018   TRIG 70.0 10/03/2018   HDL 66.90 10/03/2018   LDLCALC 85 10/03/2018   ALT 24 10/03/2018   AST 23 10/03/2018   NA 140 10/03/2018   K 4.1 10/03/2018   CL 107 10/03/2018   CREATININE 0.98 10/03/2018   BUN 17 10/03/2018   CO2 25 10/03/2018   TSH 2.57 10/03/2018    [place] Breast Ltd Uni Left Inc Axilla  Result Date: 11/27/2017 CLINICAL DATA:  Follow-up for probably benign left breast masses, 1 of which was previously biopsied in 2017 with pathology revealing fibrocystic changes. EXAM: DIGITAL DIAGNOSTIC BILATERAL MAMMOGRAM WITH CAD AND TOMO LEFT BREAST ULTRASOUND COMPARISON:  Previous exam(s). ACR Breast Density Category d: The breast tissue is extremely dense, which lowers the sensitivity of mammography. FINDINGS: No suspicious masses or calcifications are seen in either breast. The oval mass in the far upper posterior left breast with associated biopsy marking clip appears unchanged. There is no mammographic evidence of malignancy in either breast. Mammographic images were processed with CAD. Targeted ultrasound of the left breast was performed. The oval circumscribed hypoechoic mass in the left breast at 2 o'clock 9 cm from nipple measures 1.3 x 0.7 x 1.4 cm, unchanged to slightly smaller previously measuring 1.4 x 0.8 x 1.4 cm. The oval circumscribed hypoechoic mass in the left breast at 3 o'clock 3 cm from nipple measures 0.6 x 0.4 x 0.7 cm, also stable to slightly smaller previously measuring 0.8 x 0.4 x 0.8 cm. IMPRESSION: No findings of malignancy in either breast. RECOMMENDATION: Screening mammogram in one year.(Code:SM-B-01Y) I have discussed the findings and recommendations with the patient. Results were also provided in writing at the  conclusion of the visit. If applicable, a reminder letter will be sent to the patient regarding the next appointment. BI-RADS CATEGORY  2: Benign. Electronically Signed   By: Edwin CapJennifer  Jarosz M.D.   On: 11/27/2017 09:32   Mm Diag Breast Tomo Bilateral  Result Date: 11/27/2017 CLINICAL DATA:  Follow-up for probably benign left breast masses, 1 of which was previously biopsied in 2017 with pathology revealing fibrocystic changes. EXAM: DIGITAL DIAGNOSTIC BILATERAL MAMMOGRAM WITH CAD AND TOMO LEFT BREAST ULTRASOUND COMPARISON:  Previous exam(s). ACR Breast Density Category d: The breast tissue is extremely dense, which lowers the sensitivity  of mammography. FINDINGS: No suspicious masses or calcifications are seen in either breast. The oval mass in the far upper posterior left breast with associated biopsy marking clip appears unchanged. There is no mammographic evidence of malignancy in either breast. Mammographic images were processed with CAD. Targeted ultrasound of the left breast was performed. The oval circumscribed hypoechoic mass in the left breast at 2 o'clock 9 cm from nipple measures 1.3 x 0.7 x 1.4 cm, unchanged to slightly smaller previously measuring 1.4 x 0.8 x 1.4 cm. The oval circumscribed hypoechoic mass in the left breast at 3 o'clock 3 cm from nipple measures 0.6 x 0.4 x 0.7 cm, also stable to slightly smaller previously measuring 0.8 x 0.4 x 0.8 cm. IMPRESSION: No findings of malignancy in either breast. RECOMMENDATION: Screening mammogram in one year.(Code:SM-B-01Y) I have discussed the findings and recommendations with the patient. Results were also provided in writing at the conclusion of the visit. If applicable, a reminder letter will be sent to the patient regarding the next appointment. BI-RADS CATEGORY  2: Benign. Electronically Signed   By: Everlean Alstrom M.D.   On: 11/27/2017 09:32    Assessment & Plan:   There are no diagnoses linked to this encounter.   No orders of the  defined types were placed in this encounter.    Follow-up: No follow-ups on file.  Walker Kehr, MD

## 2018-10-05 ENCOUNTER — Encounter: Payer: Self-pay | Admitting: Internal Medicine

## 2018-10-05 DIAGNOSIS — G4700 Insomnia, unspecified: Secondary | ICD-10-CM | POA: Insufficient documentation

## 2018-10-05 DIAGNOSIS — M25561 Pain in right knee: Secondary | ICD-10-CM | POA: Insufficient documentation

## 2018-10-05 NOTE — Assessment & Plan Note (Signed)
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  Information/exercise provided.  Sports medicine consult if needed

## 2018-10-05 NOTE — Assessment & Plan Note (Signed)
Better on Vraylar, Trintellix, Wellbutrin combination Xanax as needed 

## 2018-10-05 NOTE — Assessment & Plan Note (Signed)
Better on Vraylar, Trintellix, Wellbutrin combination Xanax as needed

## 2018-10-05 NOTE — Assessment & Plan Note (Signed)
Multifactorial.  Discussed.  Recommendations made

## 2018-10-07 LAB — QUANTIFERON-TB GOLD PLUS
Mitogen-NIL: 8.52 IU/mL
NIL: 0.02 IU/mL
QuantiFERON-TB Gold Plus: NEGATIVE
TB1-NIL: 0.01 IU/mL
TB2-NIL: 0.01 IU/mL

## 2018-10-09 ENCOUNTER — Telehealth: Payer: Self-pay | Admitting: *Deleted

## 2018-10-09 DIAGNOSIS — N393 Stress incontinence (female) (male): Secondary | ICD-10-CM

## 2018-10-09 NOTE — Telephone Encounter (Signed)
Dr.Lavoie approved referral for Physical therapy at [facility] in Feb 2020, patient never call them back to schedule so referral was closed. Patient would like to schedule and will need a new referral. Referral placed, patient aware this has been done and she can now call to schedule this.

## 2018-10-22 MED FILL — TRINTELLIX 10 MG TABLET: 10 | 30 days supply | Qty: 30 | Fill #1

## 2018-10-23 ENCOUNTER — Telehealth: Payer: Self-pay | Admitting: *Deleted

## 2018-10-23 NOTE — Telephone Encounter (Signed)
Copied from [place] [id number]. Topic: General - Call Back - No Documentation >> Oct 22, 2018 11:45 AM Erick Blinks wrote: Reason for CRM: Richardson Landry from [place] called requesting PA for pt's cariprazine Arman Filter) capsule, Richardson Landry sent a Fax notice about this Best contact: [phone]

## 2018-10-25 NOTE — Telephone Encounter (Signed)
Vraylar 1.5 mg PA initiated via CoverMyMeds  Key: [id number] [employer] phone #:[phone] Patient IdLarence Penning [id number] Group: [id number]

## 2018-10-29 ENCOUNTER — Other Ambulatory Visit: Payer: Self-pay

## 2018-10-29 ENCOUNTER — Ambulatory Visit: Payer: No Typology Code available for payment source | Attending: Obstetrics & Gynecology | Admitting: Physical Therapy

## 2018-10-29 DIAGNOSIS — R279 Unspecified lack of coordination: Secondary | ICD-10-CM | POA: Diagnosis present

## 2018-10-29 DIAGNOSIS — M6281 Muscle weakness (generalized): Secondary | ICD-10-CM | POA: Diagnosis not present

## 2018-10-29 NOTE — Telephone Encounter (Signed)
Received fax stating Vraylar 1.5 mg PA is denied.

## 2018-10-29 NOTE — Therapy (Signed)
[facility] [address] [place], [place], [address] Phone: [phone]   Fax:  [phone]  Physical Therapy Evaluation  Patient Details  Name: Heather Patterson MRN: [id number] Date of Birth: 19-Jun-1975 Referring Provider (PT): Genia DelLavoie, Marie-Lyne, MD   Encounter Date: 10/29/2018  PT End of Session - 10/29/18 1401    Visit Number  1    Date for PT Re-Evaluation  01/21/19    PT Start Time  1227    PT Stop Time  1313    PT Time Calculation (min)  46 min    Activity Tolerance  Patient tolerated treatment well    Behavior During Therapy  [facility] for tasks assessed/performed       No past medical history on file.  Past Surgical History:  Procedure Laterality Date  . APPENDECTOMY    . [facility]    There were no vitals filed for this visit.   Subjective Assessment - 10/29/18 1230    Subjective  Pt states she is having leakage with exercise, sneezing and coughing.  Kicking during kickboxing type of exercises.    Currently in Pain?  No/denies         [facility] PT Assessment - 10/29/18 0001      Assessment   Medical Diagnosis  N39.3 (ICD-10-CM) - SI (stress incontinence), female    Referring Provider (PT)  Genia DelLavoie, Marie-Lyne, MD    Onset Date/Surgical Date  --   2-3 years ago     Precautions   Precautions  None      Restrictions   Weight Bearing Restrictions  No      Balance Screen   Has the patient fallen in the past 6 months  No      Home Environment   Living Environment  Private residence    Living Arrangements  Children   3 children     Prior Function   Level of Independence  Independent    Vocation  Full time employment      Cognition   Overall Cognitive Status  Within Functional Limits for tasks assessed      Posture/Postural Control   Posture/Postural Control  No significant limitations      ROM / Strength   AROM / PROM / Strength  AROM;Strength      AROM   Overall AROM  Comments  bilat hip and lumbar WFL      Strength   Overall Strength Comments  5/5 MMT bilat LE      Ambulation/Gait   Gait Pattern  Within Functional Limits                Objective measurements completed on examination: See above findings.    Pelvic Floor Special Questions - 10/29/18 0001    Prior Pelvic/Prostate Exam  Yes    Date of Last Pelvic/Prostate Exam  --   7-8 months ago   Are you Pregnant or attempting pregnancy?  No    Prior Pregnancies  Yes    Number of Pregnancies  3    Number of Vaginal Deliveries  3    Any difficulty with labor and deliveries  Yes   2nd one face up   Currently Sexually Active  No    Urinary Leakage  Yes    How often  3x/ week or more    Pad use  when out for a long day or when exercising    Urinary urgency  No  Fluid intake  32 oz    Caffeine beverages  coffee and tea in morning    Falling out feeling (prolapse)  No    Skin Integrity  Intact    Prolapse  Anterior Wall    Pelvic Floor Internal Exam  Pt identity confirmed and consent given to do internal assess and treatment of pelvic floor    Exam Type  Vaginal    Palpation  left pubococcygeus low tone    Strength  weak squeeze, no lift    Strength # of reps  3    Strength # of seconds  5    Tone  low               PT Education - 10/29/18 1404    Education Details  Access Code: [facility]    Person(s) Educated  Patient    Methods  Explanation;Demonstration;Handout;Verbal cues;Tactile cues    Comprehension  Verbalized understanding;Returned demonstration       PT Short Term Goals - 10/29/18 1402      PT SHORT TERM GOAL #1   Title  Pt will be ind with initial HEP    Time  4    Period  Weeks    Status  New    Target Date  11/26/18      PT SHORT TERM GOAL #2   Title  Pt will be able to perform knack and report 75% less leakage when coughing    Time  4    Period  Weeks    Status  New    Target Date  11/26/18        PT Long Term Goals - 10/29/18 1359       PT LONG TERM GOAL #1   Title  Pt will report no leakage with coughing or sneezing    Time  8    Period  Weeks    Status  New    Target Date  01/21/19      PT LONG TERM GOAL #2   Title  Pt will be able to hold strong pelvic floor contraction for at least 10 seconds to work on building up endurnace    Time  8    Period  Weeks    Status  New    Target Date  01/21/19      PT LONG TERM GOAL #3   Title  Pt will be able to isolate pelvic floor muscle for improved muscle coordination when doing martial arts classes.    Time  8    Period  Weeks    Status  New    Target Date  01/21/19      PT LONG TERM GOAL #4   Title  Pt will report she can do 20 minute exercise that includes kicking without leaking    Time  8    Period  Weeks    Status  New    Target Date  01/21/19      PT LONG TERM GOAL #5   Title  Pt will be ind with advanced HEP    Time  8    Period  Weeks    Status  New    Target Date  01/21/19             Plan - 10/29/18 1252    Clinical Impression Statement  Pt is healthy 43 y/o female who has a very active lifestyle.  She has been noticing increased urinary leakage since beginning martial arts  class over the last 3 years.  She is not currently sexually active but has in the past had leakage during intercourse.  Pt has 2/5 MMT with ability to hold with some resistance, but poor strength of closure of vaginal canal.  She has weakness of pubococcygeus on the left side is only contracting at 1/5 strength most likely due to tearing from vaginal delivery.  Some bladder prolapse is observed with bearing down, but she is able to correct this with kegel.  Pt has good LE strength of 5/5 bilateral throughout.  Hip ROM and pelvic alignment all normal.  Pt co-contracts hip and thigh muscles when doing pelvic floor contraction.  Pt will benefit from skilled PT to address deficits so she can return to full activity    Personal Factors and Comorbidities  Comorbidity 2    Comorbidities   3 vaginal deliveries, left side muscle impairment    Examination-Activity Limitations  Toileting;Other    Examination-Participation Restrictions  Community Activity    Stability/Clinical Decision Making  Stable/Uncomplicated    Clinical Decision Making  Low    Rehab Potential  Good    PT Frequency  2x / week    PT Duration  12 weeks    PT Treatment/Interventions  ADLs/Self Care Home Management;Biofeedback;Cryotherapy;Moist Heat;Electrical Stimulation;Neuromuscular re-education;Therapeutic exercise;Therapeutic activities;Patient/family education;Manual techniques;Dry needling;Taping    PT Next Visit Plan  biofeedback, f/u on exercises added to HEP, pball, quadruped, core activation    PT Home Exercise Plan  Access Code: [facility]    Consulted and Agree with Plan of Care  Patient       Patient will benefit from skilled therapeutic intervention in order to improve the following deficits and impairments:  Impaired tone, Decreased strength, Decreased endurance, Decreased coordination  Visit Diagnosis: Muscle weakness (generalized)  Unspecified lack of coordination     Problem List Patient Active Problem List   Diagnosis Date Noted  . Insomnia 10/05/2018  . Knee pain, bilateral 10/05/2018  . Weight gain 09/23/2018  . 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 Cerumen impaction 06/22/2014  . Otitis, externa, infective 06/22/2014  . Tail bone pain 03/27/2013  . Dyspnea 05/28/2012  . Tachycardia 05/28/2012  . Anemia 05/28/2012  . Well adult exam 03/20/2012    Junious Silk, PT 10/29/2018, 4:28 PM  [facility] [address] [place], [place], [address] Phone: [phone]   Fax:  [phone]  Name: MYLI VANPOOL MRN: [id number] Date of Birth: Dec 24, 1975

## 2018-10-29 NOTE — Patient Instructions (Signed)
Access Code: [facility]  URL: [email or web address]  Date: 10/29/2018  Prepared by: Jari Favre   Exercises  Supine Pelvic Floor Contraction - 10 reps - 1 sets - 5 sec hold - 3x daily - 7x weekly  Sidelying Pelvic Floor Contraction with Hip Abduction - 10 reps - 1 sets - 1x daily - 7x weekly  Mini Squat with Pelvic Floor Contraction - 10 reps - 3 sets - 1x daily - 7x weekly  Quadruped Exhale with Pelvic Floor Contraction - 10 reps - 3 sets - 1x daily - 7x weekly  Quadruped Pelvic Floor Contraction with Weight Shift All Directions - 10 reps - 3 sets - 1x daily - 7x weekly  Prone Pelvic Floor Contraction on Swiss Ball - 10 reps - 3 sets - 1x daily - 7x weekly  Supine Pelvic Floor Contraction with Hip Internal External Rotation - 10 reps - 3 sets - 1x daily - 7x weekly

## 2018-10-30 NOTE — Telephone Encounter (Signed)
What I alternatives do they offer?  Thanks

## 2018-11-01 MED FILL — buPROPion HCL ER (XL) 300 M: 300 | 90 days supply | Qty: 90 | Fill #1

## 2018-11-01 MED FILL — TRINTELLIX 10 MG TABLET: 10 | 30 days supply | Qty: 30 | Fill #1

## 2018-11-12 ENCOUNTER — Other Ambulatory Visit: Payer: Self-pay

## 2018-11-12 ENCOUNTER — Encounter: Payer: Self-pay | Admitting: Physical Therapy

## 2018-11-12 ENCOUNTER — Ambulatory Visit: Payer: No Typology Code available for payment source | Admitting: Physical Therapy

## 2018-11-12 DIAGNOSIS — R279 Unspecified lack of coordination: Secondary | ICD-10-CM

## 2018-11-12 DIAGNOSIS — M6281 Muscle weakness (generalized): Secondary | ICD-10-CM | POA: Diagnosis not present

## 2018-11-12 NOTE — Therapy (Signed)
[facility] [address], [place], [place], [address] Phone: [phone]   Fax:  [phone]  Physical Therapy Treatment  Patient Details  Name: Heather Patterson MRN: [id number] Date of Birth: Dec 25, 1975 Referring Provider (PT): [name], MD   Encounter Date: 11/12/2018  PT End of Session - 11/12/18 1148    Visit Number  2    Date for PT Re-Evaluation  01/21/19    PT Start Time  1147    PT Stop Time  1225    PT Time Calculation (min)  38 min    Activity Tolerance  Patient tolerated treatment well    Behavior During Therapy  [facility] for tasks assessed/performed       History reviewed. No pertinent past medical history.  Past Surgical History:  Procedure Laterality Date  . APPENDECTOMY    . BREAST BIOPSY Left 2017   [facility]    There were no vitals filed for this visit.  Subjective Assessment - 11/12/18 1214    Subjective  Pt states she had some leakage when playing volleyball.  I had to change after only 5 minutes of playing.    Currently in Pain?  No/denies                       [facility] Adult PT Treatment/Exercise - 11/12/18 0001      Neuro Re-ed    Neuro Re-ed Details   used biofeedback for assistance with working on isolating and coordination of pelvic floor strength during all exercises - pt able to sustain contraction about 10 seconds and able to contract average of 15 with standing exercises      Exercises   Exercises  Lumbar      Lumbar Exercises: Standing   Functional Squats  20 reps;5 seconds    Functional Squats Limitations  10 kegels holding in squat position    Forward Lunge  10 reps;5 seconds    Other Standing Lumbar Exercises  90 deg flexion on mat to reduce co-contraction - toes straight and pointed in - 10x 10 sec each    Other Standing Lumbar Exercises  kegel in standing cues for correct technique - 10 x 10 sec hold               PT Short Term Goals - 11/12/18  1244      PT SHORT TERM GOAL #1   Title  Pt will be ind with initial HEP    Baseline  still working on correct technique    Status  On-going      PT SHORT TERM GOAL #2   Title  Pt will be able to perform knack and report 75% less leakage when coughing    Status  On-going        PT Long Term Goals - 10/29/18 1359      PT LONG TERM GOAL #1   Title  Pt will report no leakage with coughing or sneezing    Time  8    Period  Weeks    Status  New    Target Date  01/21/19      PT LONG TERM GOAL #2   Title  Pt will be able to hold strong pelvic floor contraction for at least 10 seconds to work on building up endurnace    Time  8    Period  Weeks    Status  New    Target Date  01/21/19  PT LONG TERM GOAL #3   Title  Pt will be able to isolate pelvic floor muscle for improved muscle coordination when doing martial arts classes.    Time  8    Period  Weeks    Status  New    Target Date  01/21/19      PT LONG TERM GOAL #4   Title  Pt will report she can do 20 minute exercise that includes kicking without leaking    Time  8    Period  Weeks    Status  New    Target Date  01/21/19      PT LONG TERM GOAL #5   Title  Pt will be ind with advanced HEP    Time  8    Period  Weeks    Status  New    Target Date  01/21/19            Plan - 11/12/18 1245    Clinical Impression Statement  Pt did well with biofeedback.  She was able to correctly isolate pelvic floor with cues to prevent holding her breath and clenching her glutes.  Pt was able to hold for 10 seconds after she got the hang of the correct muscles to use.  Pt demonstrates weakness on Lt>Rt when performing single leg standing exercises.  Pt will benefit from skilled PT to continue working on muscle imbalances, strength and endurance for higher level activities such as running and sports that she would like to reutrn to perfomring.    PT Treatment/Interventions  ADLs/Self Care Home  Management;Biofeedback;Cryotherapy;Moist Heat;Electrical Stimulation;Neuromuscular re-education;Therapeutic exercise;Therapeutic activities;Patient/family education;Manual techniques;Dry needling;Taping    PT Next Visit Plan  pball, kneeling and half kneeling, biofeedback if needed - review breathing, core, and isolating pelvic floor with all exercises    PT Home Exercise Plan  [email or web address]    Recommended Other Services  initial cert signed    Consulted and Agree with Plan of Care  Patient       Patient will benefit from skilled therapeutic intervention in order to improve the following deficits and impairments:  Impaired tone, Decreased strength, Decreased endurance, Decreased coordination  Visit Diagnosis: Muscle weakness (generalized)  Unspecified lack of coordination     Problem List Patient Active Problem List   Diagnosis Date Noted  . Insomnia 10/05/2018  . Knee pain, bilateral 10/05/2018  . Weight gain 09/23/2018  . 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 Cerumen impaction 06/22/2014  . Otitis, externa, infective 06/22/2014  . Tail bone pain 03/27/2013  . Dyspnea 05/28/2012  . Tachycardia 05/28/2012  . Anemia 05/28/2012  . Well adult exam 03/20/2012    Junious SilkJakki L Alvie Fowles, PT 11/12/2018, 1:03 PM  [facility] [address], [address] [place], [place], [address] Phone: [phone]   Fax:  [phone]  Name: Heather Patterson MRN: [id number] Date of Birth: Jul 19, 1975

## 2018-11-19 ENCOUNTER — Encounter: Payer: No Typology Code available for payment source | Admitting: Physical Therapy

## 2018-11-26 ENCOUNTER — Ambulatory Visit: Payer: No Typology Code available for payment source | Admitting: Physical Therapy

## 2018-11-26 ENCOUNTER — Encounter: Payer: Self-pay | Admitting: Physical Therapy

## 2018-11-26 ENCOUNTER — Other Ambulatory Visit: Payer: Self-pay

## 2018-11-26 DIAGNOSIS — R279 Unspecified lack of coordination: Secondary | ICD-10-CM

## 2018-11-26 DIAGNOSIS — M6281 Muscle weakness (generalized): Secondary | ICD-10-CM | POA: Diagnosis not present

## 2018-11-26 NOTE — Therapy (Signed)
[facility] [address], [place], [place], [address] Phone: [phone]   Fax:  [phone]  Physical Therapy Treatment  Patient Details  Name: Heather Patterson MRN: [id number] Date of Birth: [birth year] Referring Provider (PT): [name], MD   Encounter Date: 11/26/2018  PT End of Session - 11/26/18 1150    Visit Number  3    Date for PT Re-Evaluation  01/21/19    PT Start Time  9937    PT Stop Time  1230    PT Time Calculation (min)  41 min    Activity Tolerance  Patient tolerated treatment well       History reviewed. No pertinent past medical history.  Past Surgical History:  Procedure Laterality Date  . APPENDECTOMY    . BREAST BIOPSY Left 2017   [facility]    There were no vitals filed for this visit.  Subjective Assessment - 11/26/18 1151    Subjective  I didn't participate in any vigorous activities this week.  No leakage this week because not doing anything that will cause it.    Currently in Pain?  No/denies                       [facility] Adult PT Treatment/Exercise - 11/26/18 0001      Neuro Re-ed    Neuro Re-ed Details   biofeedback througout session to ensure correct muscle coordination with actions      Lumbar Exercises: Standing   Functional Squats  20 reps;5 seconds    Functional Squats Limitations  10 kegels holding in squat position    Other Standing Lumbar Exercises  kneel and half knee with weighted ball - 15 x each way    Other Standing Lumbar Exercises  single leg stand then hopping side to side; LE in and out with small hop               PT Short Term Goals - 11/26/18 1245      PT SHORT TERM GOAL #1   Title  Pt will be ind with initial HEP    Status  Achieved      PT SHORT TERM GOAL #2   Title  Pt will be able to perform knack and report 75% less leakage when coughing    Baseline  only leakage with activities    Status  Achieved        PT Long  Term Goals - 10/29/18 1359      PT LONG TERM GOAL #1   Title  Pt will report no leakage with coughing or sneezing    Time  8    Period  Weeks    Status  New    Target Date  01/21/19      PT LONG TERM GOAL #2   Title  Pt will be able to hold strong pelvic floor contraction for at least 10 seconds to work on building up endurnace    Time  8    Period  Weeks    Status  New    Target Date  01/21/19      PT LONG TERM GOAL #3   Title  Pt will be able to isolate pelvic floor muscle for improved muscle coordination when doing martial arts classes.    Time  8    Period  Weeks    Status  New    Target Date  01/21/19  PT LONG TERM GOAL #4   Title  Pt will report she can do 20 minute exercise that includes kicking without leaking    Time  8    Period  Weeks    Status  New    Target Date  01/21/19      PT LONG TERM GOAL #5   Title  Pt will be ind with advanced HEP    Time  8    Period  Weeks    Status  New    Target Date  01/21/19            Plan - 11/26/18 1230    Clinical Impression Statement  Pt was able to progress strengthing in standing today.  She was educated on ways to incorporate excises into her day more easily so she can be more consistent.  At this time patient is limited with activities that require jumping and endurance.  She will benefit from more skilled PT to continue to progress her to HEP that will be advanced to the point that she can achieve the level of function that will maximize her health and quality of life.    PT Treatment/Interventions  ADLs/Self Care Home Management;Biofeedback;Cryotherapy;Moist Heat;Electrical Stimulation;Neuromuscular re-education;Therapeutic exercise;Therapeutic activities;Patient/family education;Manual techniques;Dry needling;Taping    PT Next Visit Plan  pball, kneeling and half kneeling, biofeedback if needed - review breathing, core, and isolating pelvic floor with all exercises    PT Home Exercise Plan  Access Code:  [facility]    Consulted and Agree with Plan of Care  Patient       Patient will benefit from skilled therapeutic intervention in order to improve the following deficits and impairments:  Impaired tone, Decreased strength, Decreased endurance, Decreased coordination  Visit Diagnosis: Muscle weakness (generalized)  Unspecified lack of coordination     Problem List Patient Active Problem List   Diagnosis Date Noted  . Insomnia 10/05/2018  . Knee pain, bilateral 10/05/2018  . Weight gain 09/23/2018  . 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 Cerumen impaction 06/22/2014  . Otitis, externa, infective 06/22/2014  . Tail bone pain 03/27/2013  . Dyspnea 05/28/2012  . Tachycardia 05/28/2012  . Anemia 05/28/2012  . Well adult exam 03/20/2012    Junious Silk, PT 11/26/2018, 12:50 PM  [facility] [address] [place], [place], [address] Phone: [phone]   Fax:  [phone]  Name: CARISMA TROUPE MRN: [id number] Date of Birth: Jun 08, 1975

## 2018-11-26 NOTE — Patient Instructions (Signed)
Access Code: [facility]  URL: [email or web address]  Date: 11/26/2018  Prepared by: Jari Favre   Exercises  Mini Squat with Pelvic Floor Contraction - 10 reps - 3 sets - 1x daily - 7x weekly  Jumping with Pelvic Floor Contraction - 5 reps - 3 sets - 1x daily - 7x weekly  Lunge with Pelvic Floor Contraction - 10 reps - 3 sets - 1x daily - 7x weekly  Mini Squat with Pelvic Floor Contraction - 10 reps - 3 sets - 1x daily - 7x weekly  Single Leg Balance with Pelvic Floor Contraction - 10 reps - 3 sets - 1x daily - 7x weekly

## 2018-11-28 ENCOUNTER — Encounter: Payer: BC Managed Care – PPO | Admitting: Obstetrics & Gynecology

## 2018-12-03 ENCOUNTER — Encounter: Payer: Self-pay | Admitting: Physical Therapy

## 2018-12-03 ENCOUNTER — Other Ambulatory Visit: Payer: Self-pay

## 2018-12-03 ENCOUNTER — Ambulatory Visit: Payer: No Typology Code available for payment source | Attending: Obstetrics & Gynecology | Admitting: Physical Therapy

## 2018-12-03 DIAGNOSIS — M6281 Muscle weakness (generalized): Secondary | ICD-10-CM | POA: Insufficient documentation

## 2018-12-03 DIAGNOSIS — R279 Unspecified lack of coordination: Secondary | ICD-10-CM | POA: Diagnosis present

## 2018-12-03 NOTE — Therapy (Addendum)
[facility] [address] [place], [place], [address] Phone: [phone]   Fax:  [phone]  Physical Therapy Treatment  Patient Details  Name: Heather Patterson MRN: [id number] Date of Birth: October 29, 1975 Referring Provider (PT): [name], MD   Encounter Date: 12/03/2018  PT End of Session - 12/03/18 1211    Visit Number  4    Date for PT Re-Evaluation  01/21/19    PT Start Time  3790    PT Stop Time  1229    PT Time Calculation (min)  40 min    Activity Tolerance  Patient tolerated treatment well    Behavior During Therapy  [facility] for tasks assessed/performed       History reviewed. No pertinent past medical history.  Past Surgical History:  Procedure Laterality Date  . APPENDECTOMY    . BREAST BIOPSY Left 2017   [facility]    There were no vitals filed for this visit.  Subjective Assessment - 12/03/18 1211    Subjective  I haven't had much time to do the exercises this week. I did not do anything to cause leakage, but I will play some volley ball today.  I will see how that goes.    Currently in Pain?  No/denies                    Pelvic Floor Special Questions - 12/03/18 0001    Pelvic Floor Internal Exam  Pt identity confirmed and consent given to do internal assess and treatment of pelvic floor and tactile cues        [facility] Adult PT Treatment/Exercise - 12/03/18 0001      Neuro Re-ed    Neuro Re-ed Details   tactile cues and verbal cues to contract and relax pelvic floor - rests between reps for maximum recruitment      Lumbar Exercises: Supine   Pelvic Tilt Limitations  kegels with tactile cues and cues to relax completely between reps      Lumbar Exercises: Quadruped   Other Quadruped Lumbar Exercises  rocking and IR of hips - 10x 5 sec holds    Other Quadruped Lumbar Exercises  modified quadruped - 5 x 5 sec holds               PT Short Term Goals - 11/26/18 1245      PT SHORT TERM GOAL #1   Title  Pt will be ind with initial HEP    Status  Achieved      PT SHORT TERM GOAL #2   Title  Pt will be able to perform knack and report 75% less leakage when coughing    Baseline  only leakage with activities    Status  Achieved        PT Long Term Goals - 12/03/18 1228      PT LONG TERM GOAL #1   Title  Pt will report no leakage with coughing or sneezing    Status  On-going      PT LONG TERM GOAL #2   Title  Pt will be able to hold strong pelvic floor contraction for at least 10 seconds to work on building up endurnace    Baseline  5 sec    Status  On-going      PT LONG TERM GOAL #3   Title  Pt will be able to isolate pelvic floor muscle for improved muscle coordination when doing martial arts classes.  Status  On-going      PT LONG TERM GOAL #4   Title  Pt will report she can do 20 minute exercise that includes kicking without leaking    Status  On-going      PT LONG TERM GOAL #5   Title  Pt will be ind with advanced HEP    Status  On-going            Plan - 12/03/18 1311    Clinical Impression Statement  Pt has improved circular contraction and endurance today especially with tactile cues and tapping to muscles on the Left side.  Pt continue to use a lot of co-contracting and needs some cues to to reduce.  She was able to perform exercises better in supine and educated on doing different positions with kegel such as quadruped and modified quadruped.  Pt educated on importance of doing exercises more regularly in order to see results.  Pt will benefit from skilled PT to ensure she is able to return to sports for maximum functional and quality of life.    Comorbidities  3 vaginal deliveries, left side muscle impairment    PT Treatment/Interventions  ADLs/Self Care Home Management;Biofeedback;Cryotherapy;Moist Heat;Electrical Stimulation;Neuromuscular re-education;Therapeutic exercise;Therapeutic activities;Patient/family education;Manual  techniques;Dry needling;Taping    PT Next Visit Plan  pball, kneeling and half kneeling, biofeedback if needed - review breathing, core, and isolating pelvic floor with all exercises    PT Home Exercise Plan  Access Code: [facility]    Consulted and Agree with Plan of Care  Patient       Patient will benefit from skilled therapeutic intervention in order to improve the following deficits and impairments:  Impaired tone, Decreased strength, Decreased endurance, Decreased coordination  Visit Diagnosis: Muscle weakness (generalized)  Unspecified lack of coordination     Problem List Patient Active Problem List   Diagnosis Date Noted  . Insomnia 10/05/2018  . Knee pain, bilateral 10/05/2018  . Weight gain 09/23/2018  . 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 Cerumen impaction 06/22/2014  . Otitis, externa, infective 06/22/2014  . Tail bone pain 03/27/2013  . Dyspnea 05/28/2012  . Tachycardia 05/28/2012  . Anemia 05/28/2012  . Well adult exam 03/20/2012    Jule Ser, PT 12/03/2018, 1:23 PM  [facility] [address], [place], [place], [address] Phone: [phone]   Fax:  [phone]  Name: Heather Patterson MRN: [id number] Date of Birth: 21-Jun-1975  PHYSICAL THERAPY DISCHARGE SUMMARY  Visits from Start of Care: 4  Current functional level related to goals / functional outcomes: See above details   Remaining deficits: See above remaining   Education / Equipment: HEP  Plan: Patient agrees to discharge.  Patient goals were not met. Patient is being discharged due to not returning since the last visit.  ?????    [employer], PT 01/14/19 8:30 AM

## 2018-12-06 MED FILL — TRINTELLIX 10 MG TABLET: 10 | 30 days supply | Qty: 30 | Fill #2

## 2018-12-24 MED FILL — VRAYLAR 1.5 MG CAPSULE: 1.5 | 30 days supply | Qty: 30 | Fill #1

## 2019-01-03 ENCOUNTER — Other Ambulatory Visit: Payer: Self-pay | Admitting: Internal Medicine

## 2019-01-03 MED ORDER — VORTIOXETINE HBR 20 MG PO TABS: 20.0000 mg | ORAL_TABLET | Freq: Every day | ORAL | 1 refills | Status: DC

## 2019-01-03 MED FILL — TRINTELLIX 20 MG TABLET: 20 | 90 days supply | Qty: 90 | Fill #0

## 2019-01-08 ENCOUNTER — Ambulatory Visit (INDEPENDENT_AMBULATORY_CARE_PROVIDER_SITE_OTHER): Payer: No Typology Code available for payment source | Admitting: Internal Medicine

## 2019-01-08 ENCOUNTER — Other Ambulatory Visit: Payer: Self-pay

## 2019-01-08 ENCOUNTER — Encounter: Payer: Self-pay | Admitting: Internal Medicine

## 2019-01-08 DIAGNOSIS — R635 Abnormal weight gain: Secondary | ICD-10-CM

## 2019-01-08 DIAGNOSIS — F4323 Adjustment disorder with mixed anxiety and depressed mood: Secondary | ICD-10-CM

## 2019-01-08 DIAGNOSIS — F5104 Psychophysiologic insomnia: Secondary | ICD-10-CM | POA: Diagnosis not present

## 2019-01-08 NOTE — Assessment & Plan Note (Signed)
Diet discussed 

## 2019-01-08 NOTE — Assessment & Plan Note (Signed)
Better on Vraylar, Trintellix, Wellbutrin combination Xanax as needed

## 2019-01-08 NOTE — Assessment & Plan Note (Signed)
Better on Vraylar, Trintellix, Wellbutrin combination

## 2019-01-08 NOTE — Progress Notes (Signed)
Subjective:  Patient ID: Heather Patterson, female    DOB: 07-Nov-1975  Age: 43 y.o. MRN: [id number]  CC: No chief complaint on file.   HPI Heather Patterson presents for depression, anxiety, wt gain f/u  Outpatient Medications Prior to Visit  Medication Sig Dispense Refill   ALPRAZolam (NIRAVAM) 1 MG dissolvable tablet Take 1 tablet (1 mg total) by mouth 2 (two) times daily as needed for anxiety. 60 tablet 1   b complex vitamins tablet Take 1 tablet by mouth daily. 100 tablet 3   buPROPion (WELLBUTRIN XL) 300 MG 24 hr tablet Take 1 tablet (300 mg total) by mouth daily. 90 tablet 2   cariprazine (VRAYLAR) capsule Take 1 capsule (1.5 mg total) by mouth daily. 30 capsule 5   cholecalciferol (VITAMIN D3) 25 MCG (1000 UT) tablet Take 1,000 Units by mouth daily.     Iron Polysacch Cmplx-B12-FA 150-0.025-1 MG CAPS Take 1 capsule by mouth daily. 90 each 2   vortioxetine HBr (TRINTELLIX) 20 MG TABS tablet Take 1 tablet (20 mg total) by mouth daily. 90 tablet 1   No facility-administered medications prior to visit.     ROS: Review of Systems  Constitutional: Negative for activity change, appetite change, chills, fatigue and unexpected weight change.  HENT: Negative for congestion, mouth sores and sinus pressure.   Eyes: Negative for visual disturbance.  Respiratory: Negative for cough and chest tightness.   Gastrointestinal: Negative for abdominal pain and nausea.  Genitourinary: Negative for difficulty urinating, frequency and vaginal pain.  Musculoskeletal: Negative for back pain and gait problem.  Skin: Negative for pallor and rash.  Neurological: Negative for dizziness, tremors, weakness, numbness and headaches.  Psychiatric/Behavioral: Negative for confusion, sleep disturbance and suicidal ideas. The patient is nervous/anxious.     Objective:  BP 126/82 (BP Location: Left Arm, Patient Position: Sitting, Cuff Size: Normal)    Pulse 73    Temp 98.2 F (36.8 C) (Oral)    Ht 5\' 8"   (1.727 m)    Wt 170 lb (77.1 kg)    SpO2 98%    BMI 25.85 kg/m   BP Readings from Last 3 Encounters:  01/08/19 126/82  10/03/18 124/88  09/18/18 120/84    Wt Readings from Last 3 Encounters:  01/08/19 170 lb (77.1 kg)  10/03/18 162 lb (73.5 kg)  09/18/18 157 lb (71.2 kg)    Physical Exam Constitutional:      General: She is not in acute distress.    Appearance: She is well-developed.  HENT:     Head: Normocephalic.     Right Ear: External ear normal.     Left Ear: External ear normal.     Nose: Nose normal.  Eyes:     General:        Right eye: No discharge.        Left eye: No discharge.     Conjunctiva/sclera: Conjunctivae normal.     Pupils: Pupils are equal, round, and reactive to light.  Neck:     Musculoskeletal: Normal range of motion and neck supple.     Thyroid: No thyromegaly.     Vascular: No JVD.     Trachea: No tracheal deviation.  Cardiovascular:     Rate and Rhythm: Normal rate and regular rhythm.     Heart sounds: Normal heart sounds.  Pulmonary:     Effort: No respiratory distress.     Breath sounds: No stridor. No wheezing.  Abdominal:     General: Bowel  sounds are normal. There is no distension.     Palpations: Abdomen is soft. There is no mass.     Tenderness: There is no abdominal tenderness. There is no guarding or rebound.  Musculoskeletal:        General: No tenderness.  Lymphadenopathy:     Cervical: No cervical adenopathy.  Skin:    Findings: No erythema or rash.  Neurological:     Cranial Nerves: No cranial nerve deficit.     Motor: No abnormal muscle tone.     Coordination: Coordination normal.     Deep Tendon Reflexes: Reflexes normal.  Psychiatric:        Behavior: Behavior normal.        Thought Content: Thought content normal.        Judgment: Judgment normal.     Lab Results  Component Value Date   WBC 5.5 10/03/2018   HGB 13.2 10/03/2018   HCT 39.8 10/03/2018   PLT 269.0 10/03/2018   GLUCOSE 102 (H) 10/03/2018    CHOL 166 10/03/2018   TRIG 70.0 10/03/2018   HDL 66.90 10/03/2018   LDLCALC 85 10/03/2018   ALT 24 10/03/2018   AST 23 10/03/2018   NA 140 10/03/2018   K 4.1 10/03/2018   CL 107 10/03/2018   CREATININE 0.98 10/03/2018   BUN 17 10/03/2018   CO2 25 10/03/2018   TSH 2.57 10/03/2018    US Breast [place]  Result Date: 11/27/2017 CLINICAL DATA:  Follow-up for probably benign left breast masses, 1 of which was previously biopsied in 2017 with pathology revealing fibrocystic changes. EXAM: DIGITAL DIAGNOSTIC BILATERAL MAMMOGRAM WITH CAD AND TOMO LEFT BREAST ULTRASOUND COMPARISON:  Previous exam(s). ACR Breast Density Category d: The breast tissue is extremely dense, which lowers the sensitivity of mammography. FINDINGS: No suspicious masses or calcifications are seen in either breast. The oval mass in the far upper posterior left breast with associated biopsy marking clip appears unchanged. There is no mammographic evidence of malignancy in either breast. Mammographic images were processed with CAD. Targeted ultrasound of the left breast was performed. The oval circumscribed hypoechoic mass in the left breast at 2 o'clock 9 cm from nipple measures 1.3 x 0.7 x 1.4 cm, unchanged to slightly smaller previously measuring 1.4 x 0.8 x 1.4 cm. The oval circumscribed hypoechoic mass in the left breast at 3 o'clock 3 cm from nipple measures 0.6 x 0.4 x 0.7 cm, also stable to slightly smaller previously measuring 0.8 x 0.4 x 0.8 cm. IMPRESSION: No findings of malignancy in either breast. RECOMMENDATION: Screening mammogram in one year.(Code:SM-B-01Y) I have discussed the findings and recommendations with the patient. Results were also provided in writing at the conclusion of the visit. If applicable, a reminder letter will be sent to the patient regarding the next appointment. BI-RADS CATEGORY  2: Benign. Electronically Signed   By: Everlean Alstrom M.D.   On: 11/27/2017 09:32   Mm Diag Breast Tomo  Bilateral  Result Date: 11/27/2017 CLINICAL DATA:  Follow-up for probably benign left breast masses, 1 of which was previously biopsied in 2017 with pathology revealing fibrocystic changes. EXAM: DIGITAL DIAGNOSTIC BILATERAL MAMMOGRAM WITH CAD AND TOMO LEFT BREAST ULTRASOUND COMPARISON:  Previous exam(s). ACR Breast Density Category d: The breast tissue is extremely dense, which lowers the sensitivity of mammography. FINDINGS: No suspicious masses or calcifications are seen in either breast. The oval mass in the far upper posterior left breast with associated biopsy marking clip appears unchanged. There  is no mammographic evidence of malignancy in either breast. Mammographic images were processed with CAD. Targeted ultrasound of the left breast was performed. The oval circumscribed hypoechoic mass in the left breast at 2 o'clock 9 cm from nipple measures 1.3 x 0.7 x 1.4 cm, unchanged to slightly smaller previously measuring 1.4 x 0.8 x 1.4 cm. The oval circumscribed hypoechoic mass in the left breast at 3 o'clock 3 cm from nipple measures 0.6 x 0.4 x 0.7 cm, also stable to slightly smaller previously measuring 0.8 x 0.4 x 0.8 cm. IMPRESSION: No findings of malignancy in either breast. RECOMMENDATION: Screening mammogram in one year.(Code:SM-B-01Y) I have discussed the findings and recommendations with the patient. Results were also provided in writing at the conclusion of the visit. If applicable, a reminder letter will be sent to the patient regarding the next appointment. BI-RADS CATEGORY  2: Benign. Electronically Signed   By: Edwin CapJennifer  Jarosz M.D.   On: 11/27/2017 09:32    Assessment & Plan:   There are no diagnoses linked to this encounter.   No orders of the defined types were placed in this encounter.    Follow-up: No follow-ups on file.  Sonda PrimesAlex Auden Wettstein, MD

## 2019-01-19 ENCOUNTER — Encounter: Payer: Self-pay | Admitting: Internal Medicine

## 2019-02-07 MED FILL — BUPROPION HCL XL 300 MG TAB: 300 | 30 days supply | Qty: 30 | Fill #2

## 2019-03-12 MED FILL — BUPROPION HCL ER (XL) 300 M: 300 | 30 days supply | Qty: 30 | Fill #3

## 2019-07-09 ENCOUNTER — Encounter: Payer: Self-pay | Admitting: Obstetrics & Gynecology

## 2019-10-07 ENCOUNTER — Other Ambulatory Visit: Payer: Self-pay | Admitting: Internal Medicine

## 2019-10-07 MED ORDER — BUPROPION HCL ER (XL) 300 MG PO TB24: 300.0000 mg | ORAL_TABLET | Freq: Every day | ORAL | 1 refills | Status: DC

## 2019-10-07 MED FILL — BUPROPION HCL ER (XL) 300 M: 300 | 90 days supply | Qty: 90 | Fill #0

## 2019-10-08 ENCOUNTER — Encounter: Payer: No Typology Code available for payment source | Admitting: Obstetrics & Gynecology

## 2019-10-08 ENCOUNTER — Encounter: Payer: No Typology Code available for payment source | Admitting: Internal Medicine

## 2019-10-15 ENCOUNTER — Ambulatory Visit (INDEPENDENT_AMBULATORY_CARE_PROVIDER_SITE_OTHER): Payer: No Typology Code available for payment source | Admitting: Obstetrics & Gynecology

## 2019-10-15 ENCOUNTER — Encounter: Payer: Self-pay | Admitting: Internal Medicine

## 2019-10-15 ENCOUNTER — Encounter: Payer: Self-pay | Admitting: Obstetrics & Gynecology

## 2019-10-15 ENCOUNTER — Telehealth: Payer: Self-pay | Admitting: Internal Medicine

## 2019-10-15 ENCOUNTER — Ambulatory Visit (INDEPENDENT_AMBULATORY_CARE_PROVIDER_SITE_OTHER): Payer: No Typology Code available for payment source | Admitting: Internal Medicine

## 2019-10-15 ENCOUNTER — Other Ambulatory Visit: Payer: Self-pay

## 2019-10-15 VITALS — BP 122/80 | Ht 68.0 in | Wt 172.0 lb

## 2019-10-15 VITALS — BP 106/70 | HR 74 | Temp 98.5°F | Ht 68.0 in | Wt 174.0 lb

## 2019-10-15 DIAGNOSIS — Z3009 Encounter for other general counseling and advice on contraception: Secondary | ICD-10-CM | POA: Diagnosis not present

## 2019-10-15 DIAGNOSIS — Z01419 Encounter for gynecological examination (general) (routine) without abnormal findings: Secondary | ICD-10-CM | POA: Diagnosis not present

## 2019-10-15 DIAGNOSIS — R5383 Other fatigue: Secondary | ICD-10-CM

## 2019-10-15 DIAGNOSIS — Z0000 Encounter for general adult medical examination without abnormal findings: Secondary | ICD-10-CM | POA: Diagnosis not present

## 2019-10-15 LAB — COMPLETE METABOLIC PANEL WITH GFR
AG Ratio: 1.7 (calc) (ref 1.0–2.5)
ALT: 12 U/L (ref 6–29)
AST: 15 U/L (ref 10–30)
Albumin: 4.3 g/dL (ref 3.6–5.1)
Alkaline phosphatase (APISO): 31 U/L (ref 31–125)
BUN: 12 mg/dL (ref 7–25)
CO2: 27 mmol/L (ref 20–32)
Calcium: 9.3 mg/dL (ref 8.6–10.2)
Chloride: 106 mmol/L (ref 98–110)
Creat: 0.94 mg/dL (ref 0.50–1.10)
GFR, Est African American: 86 mL/min/{1.73_m2} (ref >=60)
GFR, Est Non African American: 74 mL/min/{1.73_m2} (ref >=60)
Globulin: 2.6 g/dL (calc) (ref 1.9–3.7)
Glucose, Bld: 100 mg/dL — ABNORMAL HIGH (ref 65–99)
Potassium: 4.7 mmol/L (ref 3.5–5.3)
Sodium: 140 mmol/L (ref 135–146)
Total Bilirubin: 0.4 mg/dL (ref 0.2–1.2)
Total Protein: 6.9 g/dL (ref 6.1–8.1)

## 2019-10-15 MED ORDER — VITAMIN D3 50 MCG (2000 UT) PO CAPS: 2000.0000 [IU] | ORAL_CAPSULE | Freq: Every day | ORAL | 3 refills | Status: DC

## 2019-10-15 MED ORDER — ALPRAZOLAM 1 MG PO TBDP: 1.0000 mg | ORAL_TABLET | Freq: Two times a day (BID) | ORAL | 1 refills | Status: DC | PRN

## 2019-10-15 NOTE — Telephone Encounter (Signed)
New message:   Pharmacy is calling to see if ALPRAZolam (NIRAVAM) 1 MG dissolvable tablet can be switched to a tablet unless the pt needed dissolvable for some reason. She states if that is okay to please send in another electronic prescription for this medication. Please advise.

## 2019-10-15 NOTE — Progress Notes (Signed)
Subjective:  Patient ID: Heather Patterson, female    DOB: 1975-10-28  Age: 44 y.o. MRN: [id number]  CC: No chief complaint on file.   HPI Heather Patterson presents for a well exam  Outpatient Medications Prior to Visit  Medication Sig Dispense Refill   ALPRAZolam (NIRAVAM) 1 MG dissolvable tablet Take 1 tablet (1 mg total) by mouth 2 (two) times daily as needed for anxiety. 60 tablet 1   b complex vitamins tablet Take 1 tablet by mouth daily. 100 tablet 3   buPROPion (WELLBUTRIN XL) 300 MG 24 hr tablet Take 1 tablet (300 mg total) by mouth daily. 90 tablet 1   cholecalciferol (VITAMIN D3) 25 MCG (1000 UT) tablet Take 1,000 Units by mouth daily.     cariprazine (VRAYLAR) capsule Take 1 capsule (1.5 mg total) by mouth daily. (Patient not taking: Reported on 10/15/2019) 30 capsule 5   Iron Polysacch Cmplx-B12-FA 150-0.025-1 MG CAPS Take 1 capsule by mouth daily. (Patient not taking: Reported on 10/15/2019) 90 each 2   vortioxetine HBr (TRINTELLIX) 20 MG TABS tablet Take 1 tablet (20 mg total) by mouth daily. (Patient not taking: Reported on 10/15/2019) 90 tablet 1   No facility-administered medications prior to visit.    ROS: Review of Systems  Constitutional: Negative for activity change, appetite change, chills, fatigue and unexpected weight change.  HENT: Negative for congestion, mouth sores and sinus pressure.   Eyes: Negative for visual disturbance.  Respiratory: Negative for cough and chest tightness.   Gastrointestinal: Negative for abdominal pain and nausea.  Genitourinary: Negative for difficulty urinating, frequency and vaginal pain.  Musculoskeletal: Negative for back pain and gait problem.  Skin: Negative for pallor and rash.  Neurological: Negative for dizziness, tremors, weakness, numbness and headaches.  Psychiatric/Behavioral: Negative for confusion, sleep disturbance and suicidal ideas.    Objective:  BP 106/70 (BP Location: Right Arm, Patient Position: Sitting, Cuff Size:  Large)   Pulse 74   Temp 98.5 F (36.9 C) (Oral)   Ht 5\' 8"  (1.727 m)   Wt 174 lb (78.9 kg)   SpO2 99%   BMI 26.46 kg/m   BP Readings from Last 3 Encounters:  10/15/19 106/70  01/08/19 126/82  10/03/18 124/88    Wt Readings from Last 3 Encounters:  10/15/19 174 lb (78.9 kg)  01/08/19 170 lb (77.1 kg)  10/03/18 162 lb (73.5 kg)    Physical Exam Constitutional:      General: She is not in acute distress.    Appearance: She is well-developed.  HENT:     Head: Normocephalic.     Right Ear: External ear normal.     Left Ear: External ear normal.     Nose: Nose normal.  Eyes:     General:        Right eye: No discharge.        Left eye: No discharge.     Conjunctiva/sclera: Conjunctivae normal.     Pupils: Pupils are equal, round, and reactive to light.  Neck:     Thyroid: No thyromegaly.     Vascular: No JVD.     Trachea: No tracheal deviation.  Cardiovascular:     Rate and Rhythm: Normal rate and regular rhythm.     Heart sounds: Normal heart sounds.  Pulmonary:     Effort: No respiratory distress.     Breath sounds: No stridor. No wheezing.  Abdominal:     General: Bowel sounds are normal. There is no distension.  Palpations: Abdomen is soft. There is no mass.     Tenderness: There is no abdominal tenderness. There is no guarding or rebound.  Musculoskeletal:        General: No tenderness.     Cervical back: Normal range of motion and neck supple.  Lymphadenopathy:     Cervical: No cervical adenopathy.  Skin:    Findings: No erythema or rash.  Neurological:     Cranial Nerves: No cranial nerve deficit.     Motor: No abnormal muscle tone.     Coordination: Coordination normal.     Deep Tendon Reflexes: Reflexes normal.  Psychiatric:        Behavior: Behavior normal.        Thought Content: Thought content normal.        Judgment: Judgment normal.      Lab Results  Component Value Date   WBC 5.5 10/03/2018   HGB 13.2 10/03/2018   HCT 39.8  10/03/2018   PLT 269.0 10/03/2018   GLUCOSE 102 (H) 10/03/2018   CHOL 166 10/03/2018   TRIG 70.0 10/03/2018   HDL 66.90 10/03/2018   LDLCALC 85 10/03/2018   ALT 24 10/03/2018   AST 23 10/03/2018   NA 140 10/03/2018   K 4.1 10/03/2018   CL 107 10/03/2018   CREATININE 0.98 10/03/2018   BUN 17 10/03/2018   CO2 25 10/03/2018   TSH 2.57 10/03/2018    US BREAST LTD UNI LEFT INC AXILLA  Result Date: 11/27/2017 CLINICAL DATA:  Follow-up for probably benign left breast masses, 1 of which was previously biopsied in 2017 with pathology revealing fibrocystic changes. EXAM: DIGITAL DIAGNOSTIC BILATERAL MAMMOGRAM WITH CAD AND TOMO LEFT BREAST ULTRASOUND COMPARISON:  Previous exam(s). ACR Breast Density Category d: The breast tissue is extremely dense, which lowers the sensitivity of mammography. FINDINGS: No suspicious masses or calcifications are seen in either breast. The oval mass in the far upper posterior left breast with associated biopsy marking clip appears unchanged. There is no mammographic evidence of malignancy in either breast. Mammographic images were processed with CAD. Targeted ultrasound of the left breast was performed. The oval circumscribed hypoechoic mass in the left breast at 2 o'clock 9 cm from nipple measures 1.3 x 0.7 x 1.4 cm, unchanged to slightly smaller previously measuring 1.4 x 0.8 x 1.4 cm. The oval circumscribed hypoechoic mass in the left breast at 3 o'clock 3 cm from nipple measures 0.6 x 0.4 x 0.7 cm, also stable to slightly smaller previously measuring 0.8 x 0.4 x 0.8 cm. IMPRESSION: No findings of malignancy in either breast. RECOMMENDATION: Screening mammogram in one year.(Code:SM-B-01Y) I have discussed the findings and recommendations with the patient. Results were also provided in writing at the conclusion of the visit. If applicable, a reminder letter will be sent to the patient regarding the next appointment. BI-RADS CATEGORY  2: Benign. Electronically Signed   By:  Edwin Cap M.D.   On: 11/27/2017 09:32   MM DIAG BREAST TOMO BILATERAL  Result Date: 11/27/2017 CLINICAL DATA:  Follow-up for probably benign left breast masses, 1 of which was previously biopsied in 2017 with pathology revealing fibrocystic changes. EXAM: DIGITAL DIAGNOSTIC BILATERAL MAMMOGRAM WITH CAD AND TOMO LEFT BREAST ULTRASOUND COMPARISON:  Previous exam(s). ACR Breast Density Category d: The breast tissue is extremely dense, which lowers the sensitivity of mammography. FINDINGS: No suspicious masses or calcifications are seen in either breast. The oval mass in the far upper posterior left breast with associated biopsy marking clip  appears unchanged. There is no mammographic evidence of malignancy in either breast. Mammographic images were processed with CAD. Targeted ultrasound of the left breast was performed. The oval circumscribed hypoechoic mass in the left breast at 2 o'clock 9 cm from nipple measures 1.3 x 0.7 x 1.4 cm, unchanged to slightly smaller previously measuring 1.4 x 0.8 x 1.4 cm. The oval circumscribed hypoechoic mass in the left breast at 3 o'clock 3 cm from nipple measures 0.6 x 0.4 x 0.7 cm, also stable to slightly smaller previously measuring 0.8 x 0.4 x 0.8 cm. IMPRESSION: No findings of malignancy in either breast. RECOMMENDATION: Screening mammogram in one year.(Code:SM-B-01Y) I have discussed the findings and recommendations with the patient. Results were also provided in writing at the conclusion of the visit. If applicable, a reminder letter will be sent to the patient regarding the next appointment. BI-RADS CATEGORY  2: Benign. Electronically Signed   By: Edwin Cap M.D.   On: 11/27/2017 09:32    Assessment & Plan:     Follow-up: No follow-ups on file.  Sonda Primes, MD

## 2019-10-15 NOTE — Progress Notes (Signed)
Heather Patterson Sep 11, 1975 [id number]   History:    44 y.o. G3P3L3 Divorced.  Children 9, 12,14 yo.  RP:  Established patient presenting for annual gyn exam   HPI: Menses every 1-2 months with normal flow.  No breakthrough bleeding.  No pelvic pain.  Not currently sexually active.  Urine and bowel movements normal.  Breasts normal.  Health labs with family physician are pending this year.  Body mass index increased to 26.15.    Past medical history,surgical history, family history and social history were all reviewed and documented in the EPIC chart.  Gynecologic History Patient's last menstrual period was 10/11/2019.  Obstetric History OB History  Gravida Para Term Preterm AB Living  3 3       3   SAB TAB Ectopic Multiple Live Births               # Outcome Date GA Lbr Len/2nd Weight Sex Delivery Anes PTL Lv  3 Para           2 Para           1 Para              ROS: A ROS was performed and pertinent positives and negatives are included in the history.  GENERAL: No fevers or chills. HEENT: No change in vision, no earache, sore throat or sinus congestion. NECK: No pain or stiffness. CARDIOVASCULAR: No chest pain or pressure. No palpitations. PULMONARY: No shortness of breath, cough or wheeze. GASTROINTESTINAL: No abdominal pain, nausea, vomiting or diarrhea, melena or bright red blood per rectum. GENITOURINARY: No urinary frequency, urgency, hesitancy or dysuria. MUSCULOSKELETAL: No joint or muscle pain, no back pain, no recent trauma. DERMATOLOGIC: No rash, no itching, no lesions. ENDOCRINE: No polyuria, polydipsia, no heat or cold intolerance. No recent change in weight. HEMATOLOGICAL: No anemia or easy bruising or bleeding. NEUROLOGIC: No headache, seizures, numbness, tingling or weakness. PSYCHIATRIC: No depression, no loss of interest in normal activity or change in sleep pattern.     Exam:   BP 122/80 (BP Location: Right Arm, Patient Position: Sitting, Cuff Size: Normal)    Ht 5\' 8"  (1.727 m)   Wt 172 lb (78 kg)   LMP 10/11/2019   BMI 26.15 kg/m   Body mass index is 26.15 kg/m.  General appearance : Well developed well nourished female. No acute distress HEENT: Eyes: no retinal hemorrhage or exudates,  Neck supple, trachea midline, no carotid bruits, no thyroidmegaly Lungs: Clear to auscultation, no rhonchi or wheezes, or rib retractions  Heart: Regular rate and rhythm, no murmurs or gallops Breast:Examined in sitting and supine position were symmetrical in appearance, no palpable masses or tenderness,  no skin retraction, no nipple inversion, no nipple discharge, no skin discoloration, no axillary or supraclavicular lymphadenopathy Abdomen: no palpable masses or tenderness, no rebound or guarding Extremities: no edema or skin discoloration or tenderness  Pelvic: Vulva: Normal             Vagina: No gross lesions or discharge  Cervix: No gross lesions or discharge  Uterus  AV, normal size, shape and consistency, non-tender and mobile  Adnexa  Without masses or tenderness  Anus: Normal   Assessment/Plan:  44 y.o. female for annual exam   1. Well female exam with routine gynecological exam Normal gynecologic exam.  No indication to repeat a Pap test this year.  Breast exam normal.  Will schedule screening mammo now.  Health labs pending with Fam MD.  BMI 26.15.  Fitness and healthy nutrition discussed.  2. Encounter for other general counseling or advice on contraception Currently abstinent.  Will use condoms if sexually active and call back for hormonal contraception as needed.  Genia Del MD, 4:41 PM 10/15/2019

## 2019-10-16 ENCOUNTER — Other Ambulatory Visit: Payer: Self-pay | Admitting: Internal Medicine

## 2019-10-16 DIAGNOSIS — R7989 Other specified abnormal findings of blood chemistry: Secondary | ICD-10-CM

## 2019-10-16 LAB — CBC WITH DIFFERENTIAL/PLATELET
Absolute Monocytes: 525 cells/uL (ref 200–950)
Basophils Absolute: 50 cells/uL (ref 0–200)
Basophils Relative: 1 %
Eosinophils Absolute: 160 cells/uL (ref 15–500)
Eosinophils Relative: 3.2 %
HCT: 40.7 % (ref 35.0–45.0)
Hemoglobin: 12.2 g/dL (ref 11.7–15.5)
Lymphs Abs: 1590 cells/uL (ref 850–3900)
MCH: 24.9 pg — ABNORMAL LOW (ref 27.0–33.0)
MCHC: 30 g/dL — ABNORMAL LOW (ref 32.0–36.0)
MCV: 83.2 fL (ref 80.0–100.0)
MPV: 10.5 fL (ref 7.5–12.5)
Monocytes Relative: 10.5 %
Neutro Abs: 2675 cells/uL (ref 1500–7800)
Neutrophils Relative %: 53.5 %
Platelets: 293 10*3/uL (ref 140–400)
RBC: 4.89 10*6/uL (ref 3.80–5.10)
RDW: 15.2 % — ABNORMAL HIGH (ref 11.0–15.0)
Total Lymphocyte: 31.8 %
WBC: 5 10*3/uL (ref 3.8–10.8)

## 2019-10-16 LAB — VITAMIN D 25 HYDROXY (VIT D DEFICIENCY, FRACTURES): Vit D, 25-Hydroxy: 34 ng/mL (ref 30–100)

## 2019-10-16 LAB — URINALYSIS
Bilirubin Urine: NEGATIVE
Glucose, UA: NEGATIVE
Hgb urine dipstick: NEGATIVE
Ketones, ur: NEGATIVE
Leukocytes,Ua: NEGATIVE
Nitrite: NEGATIVE
Protein, ur: NEGATIVE
Specific Gravity, Urine: 1.016 (ref 1.001–1.03)
pH: 7 (ref 5.0–8.0)

## 2019-10-16 LAB — LIPID PANEL
Cholesterol: 164 mg/dL (ref <200)
HDL: 48 mg/dL — ABNORMAL LOW (ref >=50)
LDL Cholesterol (Calc): 98 mg/dL (calc)
Non-HDL Cholesterol (Calc): 116 mg/dL (calc) (ref <130)
Total CHOL/HDL Ratio: 3.4 (calc) (ref <5.0)
Triglycerides: 89 mg/dL (ref <150)

## 2019-10-16 LAB — TSH: TSH: 5.01 mIU/L — ABNORMAL HIGH

## 2019-10-16 LAB — VITAMIN B12: Vitamin B-12: 775 pg/mL (ref 200–1100)

## 2019-10-16 MED ORDER — ALPRAZOLAM 1 MG PO TABS
1.0000 mg | ORAL_TABLET | Freq: Two times a day (BID) | ORAL | 1 refills | Status: DC | PRN
Start: 2019-10-16 — End: 2020-10-27

## 2019-10-16 MED FILL — ALPRAZOLAM 1 MG TABS: 1 | 30 days supply | Qty: 60 | Fill #0

## 2019-10-16 NOTE — Addendum Note (Signed)
Addended by: Tresa Garter on: 10/16/2019 01:16 PM   Modules accepted: Orders

## 2019-10-16 NOTE — Telephone Encounter (Signed)
Ok thx.

## 2020-01-08 MED FILL — buPROPion HCL ER (XL) 300 M: 300 | 90 days supply | Qty: 90 | Fill #1

## 2020-07-09 DIAGNOSIS — F339 Major depressive disorder, recurrent, unspecified: Secondary | ICD-10-CM | POA: Diagnosis not present

## 2020-07-29 DIAGNOSIS — F339 Major depressive disorder, recurrent, unspecified: Secondary | ICD-10-CM | POA: Diagnosis not present

## 2020-08-09 DIAGNOSIS — F339 Major depressive disorder, recurrent, unspecified: Secondary | ICD-10-CM | POA: Diagnosis not present

## 2020-08-14 DIAGNOSIS — F339 Major depressive disorder, recurrent, unspecified: Secondary | ICD-10-CM | POA: Diagnosis not present

## 2020-08-23 DIAGNOSIS — F339 Major depressive disorder, recurrent, unspecified: Secondary | ICD-10-CM | POA: Diagnosis not present

## 2020-09-22 DIAGNOSIS — F339 Major depressive disorder, recurrent, unspecified: Secondary | ICD-10-CM | POA: Diagnosis not present

## 2020-10-08 DIAGNOSIS — F339 Major depressive disorder, recurrent, unspecified: Secondary | ICD-10-CM | POA: Diagnosis not present

## 2020-10-21 DIAGNOSIS — F339 Major depressive disorder, recurrent, unspecified: Secondary | ICD-10-CM | POA: Diagnosis not present

## 2020-10-27 ENCOUNTER — Other Ambulatory Visit (HOSPITAL_COMMUNITY): Payer: Self-pay

## 2020-10-27 ENCOUNTER — Encounter: Payer: Self-pay | Admitting: Internal Medicine

## 2020-10-27 ENCOUNTER — Other Ambulatory Visit: Payer: Self-pay

## 2020-10-27 ENCOUNTER — Ambulatory Visit (INDEPENDENT_AMBULATORY_CARE_PROVIDER_SITE_OTHER): Payer: BC Managed Care – PPO | Admitting: Internal Medicine

## 2020-10-27 DIAGNOSIS — Z0000 Encounter for general adult medical examination without abnormal findings: Secondary | ICD-10-CM | POA: Diagnosis not present

## 2020-10-27 DIAGNOSIS — R5383 Other fatigue: Secondary | ICD-10-CM

## 2020-10-27 DIAGNOSIS — F4323 Adjustment disorder with mixed anxiety and depressed mood: Secondary | ICD-10-CM | POA: Diagnosis not present

## 2020-10-27 LAB — CBC WITH DIFFERENTIAL/PLATELET
Basophils Absolute: 0 10*3/uL (ref 0.0–0.1)
Basophils Relative: 0.4 % (ref 0.0–3.0)
Eosinophils Absolute: 0.1 10*3/uL (ref 0.0–0.7)
Eosinophils Relative: 1.5 % (ref 0.0–5.0)
HCT: 35.3 % — ABNORMAL LOW (ref 36.0–46.0)
Hemoglobin: 11.1 g/dL — ABNORMAL LOW (ref 12.0–15.0)
Lymphocytes Relative: 21.8 % (ref 12.0–46.0)
Lymphs Abs: 1.6 10*3/uL (ref 0.7–4.0)
MCHC: 31.5 g/dL (ref 30.0–36.0)
MCV: 72.9 fl — ABNORMAL LOW (ref 78.0–100.0)
Monocytes Absolute: 0.7 10*3/uL (ref 0.1–1.0)
Monocytes Relative: 9.4 % (ref 3.0–12.0)
Neutro Abs: 4.9 10*3/uL (ref 1.4–7.7)
Neutrophils Relative %: 66.9 % (ref 43.0–77.0)
Platelets: 322 10*3/uL (ref 150.0–400.0)
RBC: 4.85 Mil/uL (ref 3.87–5.11)
RDW: 16.7 % — ABNORMAL HIGH (ref 11.5–15.5)
WBC: 7.3 10*3/uL (ref 4.0–10.5)

## 2020-10-27 LAB — URINALYSIS
Bilirubin Urine: NEGATIVE
Hgb urine dipstick: NEGATIVE
Ketones, ur: NEGATIVE
Leukocytes,Ua: NEGATIVE
Nitrite: NEGATIVE
Specific Gravity, Urine: 1.01 (ref 1.000–1.030)
Total Protein, Urine: NEGATIVE
Urine Glucose: NEGATIVE
Urobilinogen, UA: 0.2 (ref 0.0–1.0)
pH: 7.5 (ref 5.0–8.0)

## 2020-10-27 LAB — LIPID PANEL
Cholesterol: 146 mg/dL (ref 0–200)
HDL: 50.8 mg/dL (ref >39.00)
LDL Cholesterol: 79 mg/dL (ref 0–99)
NonHDL: 95.29
Total CHOL/HDL Ratio: 3
Triglycerides: 83 mg/dL (ref 0.0–149.0)
VLDL: 16.6 mg/dL (ref 0.0–40.0)

## 2020-10-27 LAB — COMPREHENSIVE METABOLIC PANEL
ALT: 15 U/L (ref 0–35)
AST: 20 U/L (ref 0–37)
Albumin: 4.1 g/dL (ref 3.5–5.2)
Alkaline Phosphatase: 28 U/L — ABNORMAL LOW (ref 39–117)
BUN: 12 mg/dL (ref 6–23)
CO2: 25 mEq/L (ref 19–32)
Calcium: 9.4 mg/dL (ref 8.4–10.5)
Chloride: 104 mEq/L (ref 96–112)
Creatinine, Ser: 0.99 mg/dL (ref 0.40–1.20)
GFR: 69.01 mL/min (ref >60.00)
Glucose, Bld: 93 mg/dL (ref 70–99)
Potassium: 4.4 mEq/L (ref 3.5–5.1)
Sodium: 137 mEq/L (ref 135–145)
Total Bilirubin: 0.3 mg/dL (ref 0.2–1.2)
Total Protein: 7.4 g/dL (ref 6.0–8.3)

## 2020-10-27 LAB — T4, FREE: Free T4: 0.68 ng/dL (ref 0.60–1.60)

## 2020-10-27 LAB — TSH: TSH: 3.14 u[IU]/mL (ref 0.35–5.50)

## 2020-10-27 MED ORDER — ALPRAZOLAM 1 MG PO TABS
1.0000 mg | ORAL_TABLET | Freq: Two times a day (BID) | ORAL | 1 refills | Status: DC | PRN
  Filled 2020-10-27: qty 60, 30d supply, fill #0
  Filled 2021-04-12 – 2021-04-21 (×2): qty 60, 30d supply, fill #1

## 2020-10-27 NOTE — Assessment & Plan Note (Addendum)
Chronic -likely multifactorial. Take a power nap after lunch if possible Obtain lab work including TSH, B12, CBC

## 2020-10-27 NOTE — Assessment & Plan Note (Addendum)

## 2020-10-27 NOTE — Progress Notes (Signed)
Subjective:  Patient ID: Heather Patterson, female    DOB: 04-30-1975  Age: 45 y.o. MRN: [id number]  CC: Annual Exam   HPI JHOANNA HEYDE presents for a well exam C/o fatigue   Outpatient Medications Prior to Visit  Medication Sig Dispense Refill   ALPRAZolam (XANAX) 1 MG tablet Take 1 tablet (1 mg total) by mouth 2 (two) times daily as needed for anxiety. 60 tablet 1   b complex vitamins tablet Take 1 tablet by mouth daily. 100 tablet 3   Cholecalciferol (VITAMIN D3) 50 MCG (2000 UT) capsule Take 1 capsule (2,000 Units total) by mouth daily. 100 capsule 3   buPROPion (WELLBUTRIN XL) 300 MG 24 hr tablet TAKE 1 TABLET (300 MG TOTAL) BY MOUTH DAILY. 90 tablet 1   No facility-administered medications prior to visit.    ROS: Review of Systems  Constitutional:  Positive for fatigue and unexpected weight change. Negative for activity change, appetite change, chills and fever.  HENT:  Negative for congestion, mouth sores and sinus pressure.   Eyes:  Negative for visual disturbance.  Respiratory:  Negative for cough and chest tightness.   Gastrointestinal:  Negative for abdominal pain and nausea.  Genitourinary:  Negative for difficulty urinating, frequency and vaginal pain.  Musculoskeletal:  Negative for back pain and gait problem.  Skin:  Negative for pallor and rash.  Neurological:  Negative for dizziness, tremors, weakness, numbness and headaches.  Psychiatric/Behavioral:  Negative for confusion, sleep disturbance and suicidal ideas.    Objective:  BP 102/70 (BP Location: Left Arm)   Pulse 60   Temp 98.8 F (37.1 C) (Oral)   Ht 5\' 8"  (1.727 m)   Wt 168 lb 9.6 oz (76.5 kg)   SpO2 97%   BMI 25.64 kg/m   BP Readings from Last 3 Encounters:  10/27/20 102/70  10/15/19 122/80  10/15/19 106/70    Wt Readings from Last 3 Encounters:  10/27/20 168 lb 9.6 oz (76.5 kg)  10/15/19 172 lb (78 kg)  10/15/19 174 lb (78.9 kg)    Physical Exam Constitutional:      General: She is  not in acute distress.    Appearance: She is well-developed.  HENT:     Head: Normocephalic.     Right Ear: External ear normal.     Left Ear: External ear normal.     Nose: Nose normal.  Eyes:     General:        Right eye: No discharge.        Left eye: No discharge.     Conjunctiva/sclera: Conjunctivae normal.     Pupils: Pupils are equal, round, and reactive to light.  Neck:     Thyroid: No thyromegaly.     Vascular: No JVD.     Trachea: No tracheal deviation.  Cardiovascular:     Rate and Rhythm: Normal rate and regular rhythm.     Heart sounds: Normal heart sounds.  Pulmonary:     Effort: No respiratory distress.     Breath sounds: No stridor. No wheezing.  Abdominal:     General: Bowel sounds are normal. There is no distension.     Palpations: Abdomen is soft. There is no mass.     Tenderness: There is no abdominal tenderness. There is no guarding or rebound.  Musculoskeletal:        General: No tenderness.     Cervical back: Normal range of motion and neck supple. No rigidity.  Lymphadenopathy:  Cervical: No cervical adenopathy.  Skin:    Findings: No erythema or rash.  Neurological:     Mental Status: She is oriented to person, place, and time.     Cranial Nerves: No cranial nerve deficit.     Motor: No abnormal muscle tone.     Coordination: Coordination normal.     Deep Tendon Reflexes: Reflexes normal.  Psychiatric:        Behavior: Behavior normal.        Thought Content: Thought content normal.        Judgment: Judgment normal.    Lab Results  Component Value Date   WBC 5.0 10/15/2019   HGB 12.2 10/15/2019   HCT 40.7 10/15/2019   PLT 293 10/15/2019   GLUCOSE 100 (H) 10/15/2019   CHOL 164 10/15/2019   TRIG 89 10/15/2019   HDL 48 (L) 10/15/2019   LDLCALC 98 10/15/2019   ALT 12 10/15/2019   AST 15 10/15/2019   NA 140 10/15/2019   K 4.7 10/15/2019   CL 106 10/15/2019   CREATININE 0.94 10/15/2019   BUN 12 10/15/2019   CO2 27 10/15/2019    TSH 5.01 (H) 10/15/2019    US BREAST LTD UNI LEFT INC AXILLA  Result Date: 11/27/2017 CLINICAL DATA:  Follow-up for probably benign left breast masses, 1 of which was previously biopsied in 2017 with pathology revealing fibrocystic changes. EXAM: DIGITAL DIAGNOSTIC BILATERAL MAMMOGRAM WITH CAD AND TOMO LEFT BREAST ULTRASOUND COMPARISON:  Previous exam(s). ACR Breast Density Category d: The breast tissue is extremely dense, which lowers the sensitivity of mammography. FINDINGS: No suspicious masses or calcifications are seen in either breast. The oval mass in the far upper posterior left breast with associated biopsy marking clip appears unchanged. There is no mammographic evidence of malignancy in either breast. Mammographic images were processed with CAD. Targeted ultrasound of the left breast was performed. The oval circumscribed hypoechoic mass in the left breast at 2 o'clock 9 cm from nipple measures 1.3 x 0.7 x 1.4 cm, unchanged to slightly smaller previously measuring 1.4 x 0.8 x 1.4 cm. The oval circumscribed hypoechoic mass in the left breast at 3 o'clock 3 cm from nipple measures 0.6 x 0.4 x 0.7 cm, also stable to slightly smaller previously measuring 0.8 x 0.4 x 0.8 cm. IMPRESSION: No findings of malignancy in either breast. RECOMMENDATION: Screening mammogram in one year.(Code:SM-B-01Y) I have discussed the findings and recommendations with the patient. Results were also provided in writing at the conclusion of the visit. If applicable, a reminder letter will be sent to the patient regarding the next appointment. BI-RADS CATEGORY  2: Benign. Electronically Signed   By: Edwin Cap M.D.   On: 11/27/2017 09:32   MM DIAG BREAST TOMO BILATERAL  Result Date: 11/27/2017 CLINICAL DATA:  Follow-up for probably benign left breast masses, 1 of which was previously biopsied in 2017 with pathology revealing fibrocystic changes. EXAM: DIGITAL DIAGNOSTIC BILATERAL MAMMOGRAM WITH CAD AND TOMO LEFT BREAST  ULTRASOUND COMPARISON:  Previous exam(s). ACR Breast Density Category d: The breast tissue is extremely dense, which lowers the sensitivity of mammography. FINDINGS: No suspicious masses or calcifications are seen in either breast. The oval mass in the far upper posterior left breast with associated biopsy marking clip appears unchanged. There is no mammographic evidence of malignancy in either breast. Mammographic images were processed with CAD. Targeted ultrasound of the left breast was performed. The oval circumscribed hypoechoic mass in the left breast at 2 o'clock 9 cm from  nipple measures 1.3 x 0.7 x 1.4 cm, unchanged to slightly smaller previously measuring 1.4 x 0.8 x 1.4 cm. The oval circumscribed hypoechoic mass in the left breast at 3 o'clock 3 cm from nipple measures 0.6 x 0.4 x 0.7 cm, also stable to slightly smaller previously measuring 0.8 x 0.4 x 0.8 cm. IMPRESSION: No findings of malignancy in either breast. RECOMMENDATION: Screening mammogram in one year.(Code:SM-B-01Y) I have discussed the findings and recommendations with the patient. Results were also provided in writing at the conclusion of the visit. If applicable, a reminder letter will be sent to the patient regarding the next appointment. BI-RADS CATEGORY  2: Benign. Electronically Signed   By: Edwin Cap M.D.   On: 11/27/2017 09:32    Assessment & Plan:     Sonda Primes, MD

## 2020-10-27 NOTE — Assessment & Plan Note (Addendum)
Stress at home Xanax as needed -rare use  Potential benefits of a long term benzodiazepines  use as well as potential risks  and complications were explained to the patient and were aknowledged.

## 2020-10-29 ENCOUNTER — Other Ambulatory Visit (INDEPENDENT_AMBULATORY_CARE_PROVIDER_SITE_OTHER): Payer: BC Managed Care – PPO

## 2020-10-29 ENCOUNTER — Other Ambulatory Visit (HOSPITAL_COMMUNITY): Payer: Self-pay

## 2020-10-29 DIAGNOSIS — D500 Iron deficiency anemia secondary to blood loss (chronic): Secondary | ICD-10-CM | POA: Diagnosis not present

## 2020-10-29 LAB — IBC PANEL
Iron: 12 ug/dL — ABNORMAL LOW (ref 42–145)
Saturation Ratios: 2.2 % — ABNORMAL LOW (ref 20.0–50.0)
TIBC: 557.2 ug/dL — ABNORMAL HIGH (ref 250.0–450.0)
Transferrin: 398 mg/dL — ABNORMAL HIGH (ref 212.0–360.0)

## 2020-11-12 ENCOUNTER — Other Ambulatory Visit: Payer: Self-pay | Admitting: Internal Medicine

## 2020-11-12 DIAGNOSIS — D500 Iron deficiency anemia secondary to blood loss (chronic): Secondary | ICD-10-CM

## 2020-11-12 DIAGNOSIS — F339 Major depressive disorder, recurrent, unspecified: Secondary | ICD-10-CM | POA: Diagnosis not present

## 2020-12-09 DIAGNOSIS — F339 Major depressive disorder, recurrent, unspecified: Secondary | ICD-10-CM | POA: Diagnosis not present

## 2020-12-21 ENCOUNTER — Other Ambulatory Visit (HOSPITAL_COMMUNITY): Payer: Self-pay

## 2020-12-21 ENCOUNTER — Other Ambulatory Visit: Payer: Self-pay | Admitting: Internal Medicine

## 2020-12-21 MED ORDER — NIRMATRELVIR&RITONAVIR 300/100 20 X 150 MG & 10 X 100MG PO TBPK
3.0000 | ORAL_TABLET | Freq: Two times a day (BID) | ORAL | 0 refills | Status: AC
  Filled 2020-12-21: qty 30, 5d supply, fill #0

## 2020-12-22 ENCOUNTER — Telehealth: Payer: BC Managed Care – PPO | Admitting: Family Medicine

## 2020-12-22 ENCOUNTER — Encounter: Payer: Self-pay | Admitting: Family Medicine

## 2020-12-23 NOTE — Progress Notes (Signed)
Cancelled.  

## 2020-12-28 ENCOUNTER — Other Ambulatory Visit (HOSPITAL_COMMUNITY)
Admission: RE | Admit: 2020-12-28 | Discharge: 2020-12-28 | Disposition: A | Discharge status: Home / Self Care | Payer: BC Managed Care – PPO | Source: Ambulatory Visit | Attending: Obstetrics & Gynecology | Admitting: Obstetrics & Gynecology

## 2020-12-28 ENCOUNTER — Encounter: Payer: Self-pay | Admitting: Obstetrics & Gynecology

## 2020-12-28 ENCOUNTER — Ambulatory Visit (INDEPENDENT_AMBULATORY_CARE_PROVIDER_SITE_OTHER): Payer: BC Managed Care – PPO | Admitting: Obstetrics & Gynecology

## 2020-12-28 ENCOUNTER — Other Ambulatory Visit: Payer: Self-pay

## 2020-12-28 VITALS — BP 118/78 | HR 83 | Resp 16 | Ht 67.25 in | Wt 168.0 lb

## 2020-12-28 DIAGNOSIS — N6321 Unspecified lump in the left breast, upper outer quadrant: Secondary | ICD-10-CM | POA: Diagnosis not present

## 2020-12-28 DIAGNOSIS — Z3009 Encounter for other general counseling and advice on contraception: Secondary | ICD-10-CM | POA: Diagnosis not present

## 2020-12-28 DIAGNOSIS — Z01419 Encounter for gynecological examination (general) (routine) without abnormal findings: Secondary | ICD-10-CM

## 2020-12-28 NOTE — Progress Notes (Signed)
Heather Patterson May 06, 1975 [id number]   History:    45 y.o. G3P3L3 Divorced.  Children 10, 13,15 yo.   RP:  Established patient presenting for annual gyn exam    HPI: Menses every month with normal flow.  No breakthrough bleeding.  No pelvic pain.  Not currently sexually active.  Urine and bowel movements normal.  Breasts normal.  Health labs with family physician.  Body mass index stable 26.12.     Past medical history,surgical history, family history and social history were all reviewed and documented in the EPIC chart.  Gynecologic History Patient's last menstrual period was 12/10/2020 (exact date).  Obstetric History OB History  Gravida Para Term Preterm AB Living  3 3       3   SAB IAB Ectopic Multiple Live Births               # Outcome Date GA Lbr Len/2nd Weight Sex Delivery Anes PTL Lv  3 Para           2 Para           1 Para              ROS: A ROS was performed and pertinent positives and negatives are included in the history.  GENERAL: No fevers or chills. HEENT: No change in vision, no earache, sore throat or sinus congestion. NECK: No pain or stiffness. CARDIOVASCULAR: No chest pain or pressure. No palpitations. PULMONARY: No shortness of breath, cough or wheeze. GASTROINTESTINAL: No abdominal pain, nausea, vomiting or diarrhea, melena or bright red blood per rectum. GENITOURINARY: No urinary frequency, urgency, hesitancy or dysuria. MUSCULOSKELETAL: No joint or muscle pain, no back pain, no recent trauma. DERMATOLOGIC: No rash, no itching, no lesions. ENDOCRINE: No polyuria, polydipsia, no heat or cold intolerance. No recent change in weight. HEMATOLOGICAL: No anemia or easy bruising or bleeding. NEUROLOGIC: No headache, seizures, numbness, tingling or weakness. PSYCHIATRIC: No depression, no loss of interest in normal activity or change in sleep pattern.     Exam:   BP 118/78   Pulse 83   Resp 16   Ht 5' 7.25" (1.708 m)   Wt 168 lb (76.2 kg)   LMP 12/10/2020  (Exact Date)   BMI 26.12 kg/m   Body mass index is 26.12 kg/m.  General appearance : Well developed well nourished female. No acute distress HEENT: Eyes: no retinal hemorrhage or exudates,  Neck supple, trachea midline, no carotid bruits, no thyroidmegaly Lungs: Clear to auscultation, no rhonchi or wheezes, or rib retractions  Heart: Regular rate and rhythm, no murmurs or gallops Breast:Examined in sitting and supine position were symmetrical in appearance.  Rt breast: no palpable masses or tenderness,  no skin retraction, no nipple inversion, no nipple discharge, no skin discoloration, no axillary or supraclavicular lymphadenopathy.  Lt breast 2 x 2 cm mobile, NT nodule at 1 O'Clock close to nipple, tenderness,  no skin retraction, no nipple inversion, no nipple discharge, no skin discoloration, no axillary or supraclavicular lymphadenopathy.   Abdomen: no palpable masses or tenderness, no rebound or guarding Extremities: no edema or skin discoloration or tenderness  Pelvic: Vulva: Normal             Vagina: No gross lesions or discharge  Cervix: No gross lesions or discharge.  Pap reflex done.  Uterus  AV, normal size, shape and consistency, non-tender and mobile  Adnexa  Without masses or tenderness  Anus: Normal   Assessment/Plan:  45 y.o. female  for annual exam   1. Encounter for routine gynecological examination with Papanicolaou smear of cervix Normal gynecologic exam.  Pap reflex done.  Breasts: Rt normal.  Lump on Lt breast, will investigate.  BMI 26.12 stable.  Health labs with Fam MD. - Cytology - PAP( [facility])  2. Encounter for other general counseling or advice on contraception Abstinence.  3. Breast lump on left side at 1 o'clock position  Lt breast 2 x 2 cm mobile, NT nodule at 1 O'Clock close to nipple, tenderness.  Left Dx mammo/US.  Genia Del MD, 3:54 PM 12/28/2020

## 2020-12-29 ENCOUNTER — Telehealth: Payer: Self-pay | Admitting: *Deleted

## 2020-12-29 DIAGNOSIS — N6321 Unspecified lump in the left breast, upper outer quadrant: Secondary | ICD-10-CM

## 2020-12-29 NOTE — Telephone Encounter (Signed)
-----   Message from Genia Del, MD sent at 12/28/2020  4:09 PM EDT ----- Regarding: Refer for Left Dx mammo/US and Right screening mammo Left breast nodule at 1 O'Clock, close to nipple, 2 x 2 cm, mobile, NT.

## 2020-12-29 NOTE — Telephone Encounter (Signed)
Order placed at the breast center patient scheduled on 02/07/21 @ 2:20pm my chart message sent.

## 2020-12-30 NOTE — Telephone Encounter (Signed)
Patient read my chart message on "Last read by Valentina Gu at 10:06 AM on 12/29/2020."

## 2020-12-31 DIAGNOSIS — F339 Major depressive disorder, recurrent, unspecified: Secondary | ICD-10-CM | POA: Diagnosis not present

## 2021-01-03 LAB — CYTOLOGY - PAP
Comment: NEGATIVE
Diagnosis: UNDETERMINED — AB
High risk HPV: NEGATIVE

## 2021-02-07 ENCOUNTER — Ambulatory Visit
Admission: RE | Admit: 2021-02-07 | Discharge: 2021-02-07 | Disposition: A | Discharge status: Home / Self Care | Payer: BC Managed Care – PPO | Source: Ambulatory Visit | Attending: Obstetrics & Gynecology | Admitting: Obstetrics & Gynecology

## 2021-02-07 DIAGNOSIS — N6321 Unspecified lump in the left breast, upper outer quadrant: Secondary | ICD-10-CM

## 2021-02-07 DIAGNOSIS — R922 Inconclusive mammogram: Secondary | ICD-10-CM | POA: Diagnosis not present

## 2021-02-07 DIAGNOSIS — N6002 Solitary cyst of left breast: Secondary | ICD-10-CM | POA: Diagnosis not present

## 2021-02-15 DIAGNOSIS — F339 Major depressive disorder, recurrent, unspecified: Secondary | ICD-10-CM | POA: Diagnosis not present

## 2021-03-15 DIAGNOSIS — F339 Major depressive disorder, recurrent, unspecified: Secondary | ICD-10-CM | POA: Diagnosis not present

## 2021-03-17 ENCOUNTER — Encounter: Payer: Self-pay | Admitting: Obstetrics & Gynecology

## 2021-03-17 ENCOUNTER — Ambulatory Visit: Payer: BC Managed Care – PPO | Admitting: Obstetrics & Gynecology

## 2021-03-17 ENCOUNTER — Other Ambulatory Visit: Payer: Self-pay

## 2021-03-17 VITALS — BP 120/76

## 2021-03-17 DIAGNOSIS — Z3009 Encounter for other general counseling and advice on contraception: Secondary | ICD-10-CM

## 2021-03-17 DIAGNOSIS — Z309 Encounter for contraceptive management, unspecified: Secondary | ICD-10-CM | POA: Diagnosis not present

## 2021-03-17 NOTE — Progress Notes (Signed)
° ° °  Heather Patterson Aug 18, 1975 [id number]        46 y.o.  G3P3L3 Divorced  RP: Counseling on ParaGard IUD  HPI: Annual Gyn exam 12/2020, abstinent at that time.  Now resuming sexual activities and desires to start on a long term contraceptive.  Prefers non-hormonal contraception, would like ParaGard IUD.  Read the ParaGuard IUD pamphlet.   OB History  Gravida Para Term Preterm AB Living  3 3       3   SAB IAB Ectopic Multiple Live Births               # Outcome Date GA Lbr Len/2nd Weight Sex Delivery Anes PTL Lv  3 Para           2 Para           1 Para             Past medical history,surgical history, problem list, medications, allergies, family history and social history were all reviewed and documented in the EPIC chart.   Directed ROS with pertinent positives and negatives documented in the history of present illness/assessment and plan.  Exam:  There were no vitals filed for this visit. General appearance:  Normal  Gynecologic exam: Deferred   Assessment/Plan:  46 y.o. G3P3L3  1. General counselling and advice on contraception Counseling on ParaGard IUD contraception as patient desires long term non-hormonal contraception.  ParaGard IUD pamphlet read.  Patient informed on IUD insertion, post procedure precautions with f/u at 4 weeks.  Understands the risks of infection and IUD migration.  ParaGard IUD can be kept in place x 10 yrs, but I recommend a maximum of 8 years to improve the chances of keeping the IUD intact at the time of removal.  F/U 1/27th at 4:30 for ParaGard IUD insertion.  If not on her period, will do a UPT with > 10 days protected IC. - IUD Insertion; Future   2/27 MD, 4:26 PM 03/17/2021

## 2021-03-25 ENCOUNTER — Ambulatory Visit: Payer: BC Managed Care – PPO | Admitting: Obstetrics & Gynecology

## 2021-03-25 ENCOUNTER — Ambulatory Visit (INDEPENDENT_AMBULATORY_CARE_PROVIDER_SITE_OTHER): Payer: BC Managed Care – PPO | Admitting: Obstetrics & Gynecology

## 2021-03-25 ENCOUNTER — Other Ambulatory Visit: Payer: Self-pay

## 2021-03-25 ENCOUNTER — Encounter: Payer: Self-pay | Admitting: Obstetrics & Gynecology

## 2021-03-25 VITALS — BP 120/78

## 2021-03-25 DIAGNOSIS — Z30430 Encounter for insertion of intrauterine contraceptive device: Secondary | ICD-10-CM

## 2021-03-25 DIAGNOSIS — Z01812 Encounter for preprocedural laboratory examination: Secondary | ICD-10-CM

## 2021-03-25 DIAGNOSIS — Z3009 Encounter for other general counseling and advice on contraception: Secondary | ICD-10-CM

## 2021-03-25 LAB — PREGNANCY, URINE: Preg Test, Ur: NEGATIVE

## 2021-03-25 NOTE — Progress Notes (Signed)
° ° °  Heather Patterson [birth year] [id number]        46 y.o.  G3P3   RP: ParaGard IUD insertion  HPI: ParaGard IUD insertion.  Abstinent currently.  No pelvic pain.  LMP 03/20/2021.     OB History  Gravida Para Term Preterm AB Living  3 3       3   SAB IAB Ectopic Multiple Live Births               # Outcome Date GA Lbr Len/2nd Weight Sex Delivery Anes PTL Lv  3 Para           2 Para           1 Para             Past medical history,surgical history, problem list, medications, allergies, family history and social history were all reviewed and documented in the EPIC chart.   Directed ROS with pertinent positives and negatives documented in the history of present illness/assessment and plan.  Exam:  Vitals:   03/25/21 1558  BP: 120/78   General appearance:  Normal                                                                    IUD procedure note       Patient presented to the office today for placement of ParaGard IUD. The patient had previously been provided with literature information on this method of contraception. The risks benefits and pros and cons were discussed and all her questions were answered. She is fully aware that this form of contraception is 99% effective and is good for 10 years.  Pelvic exam: Vulva normal Vagina: No lesions or discharge Cervix: No lesions or discharge Uterus: RV position, normal volume Adnexa: No masses or tenderness Rectal exam: Not done  The cervix was cleansed with Betadine solution. Hurricane spray on the cervix.  A single-tooth tenaculum was placed on the anterior cervical lip. The Os finder hysterometry was at 7 cm. The IUD was shown to the patient and inserted in a sterile fashion.  The IUD string was trimmed. The single-tooth tenaculum was removed. Patient was instructed to return back to the office in one month for follow up.     UPT Neg      Assessment/Plan:  46 y.o. G3P3   1. Encounter for IUD insertion Easy insertion of  ParaGard IUD.  No Cx.  Well tolerated by patient.  Post procedure precautions reviewed.  F/U 4 weeks for IUD check.  2. General counselling and advice on contraception - IUD Insertion  3. Encounter for preprocedural laboratory examination - Pregnancy, urine   [name] MD, 4:04 PM 03/25/2021

## 2021-03-29 DIAGNOSIS — F339 Major depressive disorder, recurrent, unspecified: Secondary | ICD-10-CM | POA: Diagnosis not present

## 2021-04-12 ENCOUNTER — Other Ambulatory Visit (HOSPITAL_COMMUNITY): Payer: Self-pay

## 2021-04-20 ENCOUNTER — Other Ambulatory Visit (HOSPITAL_COMMUNITY): Payer: Self-pay

## 2021-04-20 DIAGNOSIS — F339 Major depressive disorder, recurrent, unspecified: Secondary | ICD-10-CM | POA: Diagnosis not present

## 2021-04-21 ENCOUNTER — Other Ambulatory Visit (HOSPITAL_COMMUNITY): Payer: Self-pay

## 2021-05-02 DIAGNOSIS — F339 Major depressive disorder, recurrent, unspecified: Secondary | ICD-10-CM | POA: Diagnosis not present

## 2021-05-18 ENCOUNTER — Ambulatory Visit: Payer: BC Managed Care – PPO | Admitting: Obstetrics & Gynecology

## 2021-05-18 ENCOUNTER — Encounter: Payer: Self-pay | Admitting: Obstetrics & Gynecology

## 2021-05-18 ENCOUNTER — Other Ambulatory Visit: Payer: Self-pay

## 2021-05-18 VITALS — BP 120/80 | Temp 98.1°F

## 2021-05-18 DIAGNOSIS — R350 Frequency of micturition: Secondary | ICD-10-CM

## 2021-05-18 DIAGNOSIS — Z30431 Encounter for routine checking of intrauterine contraceptive device: Secondary | ICD-10-CM | POA: Diagnosis not present

## 2021-05-18 LAB — URINALYSIS, COMPLETE W/RFL CULTURE
Bacteria, UA: NONE SEEN /HPF
Bilirubin Urine: NEGATIVE
Casts: NONE SEEN /LPF
Crystals: NONE SEEN /HPF
Glucose, UA: NEGATIVE
Hgb urine dipstick: NEGATIVE
Hyaline Cast: NONE SEEN /LPF
Leukocyte Esterase: NEGATIVE
Nitrites, Initial: NEGATIVE
Protein, ur: NEGATIVE
RBC / HPF: NONE SEEN /HPF (ref 0–2)
Specific Gravity, Urine: 1.025 (ref 1.001–1.035)
WBC, UA: NONE SEEN /HPF (ref 0–5)
Yeast: NONE SEEN /HPF
pH: 5 (ref 5.0–8.0)

## 2021-05-18 LAB — NO CULTURE INDICATED

## 2021-05-18 NOTE — Progress Notes (Signed)
? ? ?  Heather Patterson 30-Oct-1975 [id number] ? ? ?     46 y.o.  G3P3L3 ? ?RP: Paraguard IUD check post insertion on 03/25/2021 ? ?HPI: C/O Urinary frequency and urgency.  Drinks coffee.  No fever.  No pelvic pain.  No pain with IC.  No BTB.  Normal vaginal secretions.  Was able to feel the strings at the cervix. ? ? ?OB History  ?Gravida Para Term Preterm AB Living  ?3 3       3   ?SAB IAB Ectopic Multiple Live Births  ?           ?  ?# Outcome Date GA Lbr Len/2nd Weight Sex Delivery Anes PTL Lv  ?3 Para           ?2 Para           ?1 Para           ? ? ?Past medical history,surgical history, problem list, medications, allergies, family history and social history were all reviewed and documented in the EPIC chart. ? ? ?Directed ROS with pertinent positives and negatives documented in the history of present illness/assessment and plan. ? ?Exam: ? ?There were no vitals filed for this visit. ?General appearance:  Normal ? ?CVAT Negative bilaterally ? ?Abdomen: Normal ? ?Gynecologic exam: Vulva normal.  Speculum:  Cervix normal.  IUD strings visible at EO, about 1.5 cm.  No erythema.  Normal secretions.  Vagina normal. ? ?U/A completely Negative ? ? ?Assessment/Plan:  46 y.o. G3P3  ? ?1. Encounter for routine checking of intrauterine contraceptive device (IUD) ?No fever.  No pelvic pain.  No pain with IC.  No BTB.  Normal vaginal secretions.  Was able to feel the strings at the cervix.  IUD in good position with no sign of infection.  Reassured. ? ?2. Urinary frequency ?C/O Urinary frequency and urgency.  Drinks coffee.  U/A completely Negative.  Possible irritative urinary symptoms.  Decision to observe with decreased caffeine consumption and increased water intake.  If Urgency of urination persists, patient will call for reevaluation. ?- Urinalysis,Complete w/RFL Culture ? ?Other orders ?- PARAGARD INTRAUTERINE COPPER IU; by Intrauterine route. ?- REFLEXIVE URINE CULTURE  ? ?54 MD, 4:36 PM 05/18/2021 ? ? ? ?  ?

## 2021-05-25 DIAGNOSIS — F339 Major depressive disorder, recurrent, unspecified: Secondary | ICD-10-CM | POA: Diagnosis not present

## 2021-06-22 DIAGNOSIS — F339 Major depressive disorder, recurrent, unspecified: Secondary | ICD-10-CM | POA: Diagnosis not present

## 2021-07-07 DIAGNOSIS — F339 Major depressive disorder, recurrent, unspecified: Secondary | ICD-10-CM | POA: Diagnosis not present

## 2021-08-03 DIAGNOSIS — F339 Major depressive disorder, recurrent, unspecified: Secondary | ICD-10-CM | POA: Diagnosis not present

## 2021-08-17 DIAGNOSIS — F339 Major depressive disorder, recurrent, unspecified: Secondary | ICD-10-CM | POA: Diagnosis not present

## 2021-10-28 ENCOUNTER — Encounter: Payer: BC Managed Care – PPO | Admitting: Internal Medicine

## 2021-10-28 DIAGNOSIS — F339 Major depressive disorder, recurrent, unspecified: Secondary | ICD-10-CM | POA: Diagnosis not present

## 2021-11-10 ENCOUNTER — Ambulatory Visit (INDEPENDENT_AMBULATORY_CARE_PROVIDER_SITE_OTHER): Payer: BC Managed Care – PPO | Admitting: Internal Medicine

## 2021-11-10 ENCOUNTER — Other Ambulatory Visit (HOSPITAL_COMMUNITY): Payer: Self-pay

## 2021-11-10 ENCOUNTER — Encounter: Payer: Self-pay | Admitting: Internal Medicine

## 2021-11-10 VITALS — BP 110/72 | HR 77 | Temp 98.5°F | Ht 68.0 in | Wt 170.4 lb

## 2021-11-10 DIAGNOSIS — E049 Nontoxic goiter, unspecified: Secondary | ICD-10-CM

## 2021-11-10 DIAGNOSIS — Z0000 Encounter for general adult medical examination without abnormal findings: Secondary | ICD-10-CM

## 2021-11-10 LAB — COMPREHENSIVE METABOLIC PANEL
ALT: 14 U/L (ref 0–35)
AST: 16 U/L (ref 0–37)
Albumin: 4.2 g/dL (ref 3.5–5.2)
Alkaline Phosphatase: 29 U/L — ABNORMAL LOW (ref 39–117)
BUN: 13 mg/dL (ref 6–23)
CO2: 27 mEq/L (ref 19–32)
Calcium: 9.4 mg/dL (ref 8.4–10.5)
Chloride: 103 mEq/L (ref 96–112)
Creatinine, Ser: 0.91 mg/dL (ref 0.40–1.20)
GFR: 75.8 mL/min (ref >60.00)
Glucose, Bld: 90 mg/dL (ref 70–99)
Potassium: 3.8 mEq/L (ref 3.5–5.1)
Sodium: 138 mEq/L (ref 135–145)
Total Bilirubin: 0.5 mg/dL (ref 0.2–1.2)
Total Protein: 7.7 g/dL (ref 6.0–8.3)

## 2021-11-10 LAB — CBC WITH DIFFERENTIAL/PLATELET
Basophils Absolute: 0 10*3/uL (ref 0.0–0.1)
Basophils Relative: 0.7 % (ref 0.0–3.0)
Eosinophils Absolute: 0.1 10*3/uL (ref 0.0–0.7)
Eosinophils Relative: 1.5 % (ref 0.0–5.0)
HCT: 32.2 % — ABNORMAL LOW (ref 36.0–46.0)
Hemoglobin: 10.2 g/dL — ABNORMAL LOW (ref 12.0–15.0)
Lymphocytes Relative: 35.8 % (ref 12.0–46.0)
Lymphs Abs: 2.5 10*3/uL (ref 0.7–4.0)
MCHC: 31.7 g/dL (ref 30.0–36.0)
MCV: 69.7 fl — ABNORMAL LOW (ref 78.0–100.0)
Monocytes Absolute: 0.6 10*3/uL (ref 0.1–1.0)
Monocytes Relative: 8.7 % (ref 3.0–12.0)
Neutro Abs: 3.7 10*3/uL (ref 1.4–7.7)
Neutrophils Relative %: 53.3 % (ref 43.0–77.0)
Platelets: 348 10*3/uL (ref 150.0–400.0)
RBC: 4.62 Mil/uL (ref 3.87–5.11)
RDW: 16.9 % — ABNORMAL HIGH (ref 11.5–15.5)
WBC: 6.9 10*3/uL (ref 4.0–10.5)

## 2021-11-10 LAB — LIPID PANEL
Cholesterol: 178 mg/dL (ref 0–200)
HDL: 52.9 mg/dL (ref >39.00)
LDL Cholesterol: 111 mg/dL — ABNORMAL HIGH (ref 0–99)
NonHDL: 125.28
Total CHOL/HDL Ratio: 3
Triglycerides: 73 mg/dL (ref 0.0–149.0)
VLDL: 14.6 mg/dL (ref 0.0–40.0)

## 2021-11-10 LAB — TSH: TSH: 3.25 u[IU]/mL (ref 0.35–5.50)

## 2021-11-10 MED ORDER — VITAMIN D3 50 MCG (2000 UT) PO CAPS: 2000.0000 [IU] | ORAL_CAPSULE | Freq: Every day | ORAL | 3 refills | Status: AC

## 2021-11-10 MED ORDER — ALPRAZOLAM 1 MG PO TABS
1.0000 mg | ORAL_TABLET | Freq: Two times a day (BID) | ORAL | 1 refills | Status: DC | PRN
Start: 2021-11-10 — End: 2023-01-03
  Filled 2021-11-10: qty 60, 30d supply, fill #0
  Filled 2022-05-09: qty 60, 30d supply, fill #1

## 2021-11-10 NOTE — Assessment & Plan Note (Signed)

## 2021-11-10 NOTE — Assessment & Plan Note (Signed)
Will sch [place] 

## 2021-11-10 NOTE — Progress Notes (Signed)
Subjective:  Patient ID: Heather Patterson, female    DOB: 05-05-1975  Age: 46 y.o. MRN: [id number]  CC: Annual Exam   HPI Heather Patterson presents for a well exam  Outpatient Medications Prior to Visit  Medication Sig Dispense Refill   PARAGARD INTRAUTERINE COPPER IU by Intrauterine route.     ALPRAZolam (XANAX) 1 MG tablet Take 1 tablet (1 mg total) by mouth 2 (two) times daily as needed for anxiety. 60 tablet 1   No facility-administered medications prior to visit.    ROS: Review of Systems  Constitutional:  Negative for activity change, appetite change, chills, fatigue and unexpected weight change.  HENT:  Negative for congestion, mouth sores and sinus pressure.   Eyes:  Negative for visual disturbance.  Respiratory:  Negative for cough and chest tightness.   Gastrointestinal:  Negative for abdominal pain and nausea.  Genitourinary:  Positive for pelvic pain. Negative for difficulty urinating, frequency and vaginal pain.  Musculoskeletal:  Negative for back pain and gait problem.  Skin:  Negative for pallor and rash.  Neurological:  Negative for dizziness, tremors, weakness, numbness and headaches.  Psychiatric/Behavioral:  Negative for confusion and sleep disturbance.     Objective:  BP 110/72 (BP Location: Left Arm)   Pulse 77   Temp 98.5 F (36.9 C) (Oral)   Ht 5\' 8"  (1.727 m)   Wt 170 lb 6.4 oz (77.3 kg)   SpO2 97%   BMI 25.91 kg/m   BP Readings from Last 3 Encounters:  11/14/21 110/82  11/10/21 110/72  05/18/21 120/80    Wt Readings from Last 3 Encounters:  11/10/21 170 lb 6.4 oz (77.3 kg)  12/28/20 168 lb (76.2 kg)  10/27/20 168 lb 9.6 oz (76.5 kg)    Physical Exam Constitutional:      General: She is not in acute distress.    Appearance: She is well-developed.  HENT:     Head: Normocephalic.     Right Ear: External ear normal.     Left Ear: External ear normal.     Nose: Nose normal.  Eyes:     General:        Right eye: No discharge.         Left eye: No discharge.     Conjunctiva/sclera: Conjunctivae normal.     Pupils: Pupils are equal, round, and reactive to light.  Neck:     Thyroid: No thyromegaly.     Vascular: No JVD.     Trachea: No tracheal deviation.  Cardiovascular:     Rate and Rhythm: Normal rate and regular rhythm.     Heart sounds: Normal heart sounds.  Pulmonary:     Effort: No respiratory distress.     Breath sounds: No stridor. No wheezing.  Abdominal:     General: Bowel sounds are normal. There is no distension.     Palpations: Abdomen is soft. There is no mass.     Tenderness: There is no abdominal tenderness. There is no guarding or rebound.  Musculoskeletal:        General: No tenderness.     Cervical back: Normal range of motion and neck supple. No rigidity.  Lymphadenopathy:     Cervical: No cervical adenopathy.  Skin:    Findings: No erythema or rash.  Neurological:     Cranial Nerves: No cranial nerve deficit.     Motor: No abnormal muscle tone.     Coordination: Coordination normal.     Deep Tendon  Reflexes: Reflexes normal.  Psychiatric:        Behavior: Behavior normal.        Thought Content: Thought content normal.        Judgment: Judgment normal.   Thyroid enlargement noted, mild  Lab Results  Component Value Date   WBC 6.9 11/10/2021   HGB 10.2 (L) 11/10/2021   HCT 32.2 (L) 11/10/2021   PLT 348.0 11/10/2021   GLUCOSE 90 11/10/2021   CHOL 178 11/10/2021   TRIG 73.0 11/10/2021   HDL 52.90 11/10/2021   LDLCALC 111 (H) 11/10/2021   ALT 14 11/10/2021   AST 16 11/10/2021   NA 138 11/10/2021   K 3.8 11/10/2021   CL 103 11/10/2021   CREATININE 0.91 11/10/2021   BUN 13 11/10/2021   CO2 27 11/10/2021   TSH 3.25 11/10/2021    MM DIAG BREAST TOMO BILATERAL  Result Date: 02/07/2021 CLINICAL DATA:  Palpable abnormality in the LEFT breast at 1 o'clock, 2 centimeters from the nipple. History of benign biopsy of the LEFT breast performed 10/25/2015 showing benign fibrocystic  changes. EXAM: DIGITAL DIAGNOSTIC BILATERAL MAMMOGRAM WITH TOMOSYNTHESIS AND CAD; ULTRASOUND LEFT BREAST LIMITED TECHNIQUE: Bilateral digital diagnostic mammography and breast tomosynthesis was performed. The images were evaluated with computer-aided detection.; Targeted ultrasound examination of the left breast was performed. COMPARISON:  Previous exam(s). ACR Breast Density Category d: The breast tissue is extremely dense, which lowers the sensitivity of mammography. FINDINGS: RIGHT breast is negative. In the UPPER retroareolar region of the LEFT breast, there is a partially obscured oval mass further evaluated with ultrasound. On physical exam, there is a mobile superficial oval mass in the 1 o'clock retroareolar region of the LEFT breast. Targeted ultrasound is performed, showing a simple cyst in the 1 o'clock location of the LEFT breast 1 centimeter from the nipple measuring 1.8 x 1.9 x 1.3 centimeters. No internal blood flow or solid component identified. IMPRESSION: Benign cyst in the LEFT breast. No mammographic or ultrasound evidence for malignancy. RECOMMENDATION: Screening mammogram in one year.(Code:SM-B-01Y) I have discussed the findings and recommendations with the patient. If applicable, a reminder letter will be sent to the patient regarding the next appointment. BI-RADS CATEGORY  2: Benign. Electronically Signed   By: Nolon Nations M.D.   On: 02/07/2021 15:39   US BREAST LTD UNI LEFT INC AXILLA  Result Date: 02/07/2021 CLINICAL DATA:  Palpable abnormality in the LEFT breast at 1 o'clock, 2 centimeters from the nipple. History of benign biopsy of the LEFT breast performed 10/25/2015 showing benign fibrocystic changes. EXAM: DIGITAL DIAGNOSTIC BILATERAL MAMMOGRAM WITH TOMOSYNTHESIS AND CAD; ULTRASOUND LEFT BREAST LIMITED TECHNIQUE: Bilateral digital diagnostic mammography and breast tomosynthesis was performed. The images were evaluated with computer-aided detection.; Targeted ultrasound  examination of the left breast was performed. COMPARISON:  Previous exam(s). ACR Breast Density Category d: The breast tissue is extremely dense, which lowers the sensitivity of mammography. FINDINGS: RIGHT breast is negative. In the UPPER retroareolar region of the LEFT breast, there is a partially obscured oval mass further evaluated with ultrasound. On physical exam, there is a mobile superficial oval mass in the 1 o'clock retroareolar region of the LEFT breast. Targeted ultrasound is performed, showing a simple cyst in the 1 o'clock location of the LEFT breast 1 centimeter from the nipple measuring 1.8 x 1.9 x 1.3 centimeters. No internal blood flow or solid component identified. IMPRESSION: Benign cyst in the LEFT breast. No mammographic or ultrasound evidence for malignancy. RECOMMENDATION: Screening mammogram in one year.(Code:SM-B-01Y)  I have discussed the findings and recommendations with the patient. If applicable, a reminder letter will be sent to the patient regarding the next appointment. BI-RADS CATEGORY  2: Benign. Electronically Signed   By: Norva Pavlov M.D.   On: 02/07/2021 15:39   Assessment & Plan:   Problem List Items Addressed This Visit     Enlarged thyroid    Will sch [place]      Relevant Orders   US THYROID   Well adult exam - Primary    We discussed age appropriate health related issues, including available/recomended screening tests and vaccinations. Labs were ordered to be later reviewed . All questions were answered. We discussed one or more of the following - seat belt use, use of sunscreen/sun exposure exercise, fall risk reduction, second hand smoke exposure, firearm use and storage, seat belt use, a need for adhering to healthy diet and exercise. Labs were ordered.  All questions were answered. Thyroid enlargement noted      Relevant Orders   TSH (Completed)   Urinalysis (Completed)   CBC with Differential/Platelet (Completed)   Lipid panel (Completed)    Comprehensive metabolic panel (Completed)      Meds ordered this encounter  Medications   Cholecalciferol (VITAMIN D3) 50 MCG (2000 UT) capsule    Sig: Take 1 capsule (2,000 Units total) by mouth daily.    Dispense:  100 capsule    Refill:  3   ALPRAZolam (XANAX) 1 MG tablet    Sig: Take 1 tablet (1 mg total) by mouth 2 (two) times daily as needed for anxiety.    Dispense:  60 tablet    Refill:  1      Follow-up: Return in about 1 year (around 11/11/2022) for Wellness Exam.  Sonda Primes, MD

## 2021-11-11 LAB — URINALYSIS
Bilirubin Urine: NEGATIVE
Hgb urine dipstick: NEGATIVE
Ketones, ur: NEGATIVE
Leukocytes,Ua: NEGATIVE
Nitrite: NEGATIVE
Specific Gravity, Urine: >=1.03 — AB (ref 1.000–1.030)
Total Protein, Urine: NEGATIVE
Urine Glucose: NEGATIVE
Urobilinogen, UA: 0.2 (ref 0.0–1.0)
pH: 5.5 (ref 5.0–8.0)

## 2021-11-14 ENCOUNTER — Ambulatory Visit: Payer: BC Managed Care – PPO | Admitting: Internal Medicine

## 2021-11-14 ENCOUNTER — Other Ambulatory Visit (HOSPITAL_COMMUNITY): Payer: Self-pay

## 2021-11-14 ENCOUNTER — Encounter: Payer: Self-pay | Admitting: Internal Medicine

## 2021-11-14 DIAGNOSIS — F339 Major depressive disorder, recurrent, unspecified: Secondary | ICD-10-CM | POA: Diagnosis not present

## 2021-11-14 DIAGNOSIS — F4323 Adjustment disorder with mixed anxiety and depressed mood: Secondary | ICD-10-CM | POA: Diagnosis not present

## 2021-11-14 DIAGNOSIS — F4321 Adjustment disorder with depressed mood: Secondary | ICD-10-CM | POA: Diagnosis not present

## 2021-11-14 MED ORDER — VORTIOXETINE HBR 10 MG PO TABS
10.0000 mg | ORAL_TABLET | Freq: Every day | ORAL | 5 refills | Status: DC
  Filled 2021-11-14 – 2021-12-08 (×3): qty 30, 30d supply, fill #0

## 2021-11-14 NOTE — Assessment & Plan Note (Addendum)
New -severe acute stress and acute grief reaction, anxiety. Heather Patterson's boyfriend died suddenly on 06-26-22 and her present.  He probably suffered a massive heart attack.  She started CPR immediately and called 911.  They tried to revive him for over an hour without success.  She is very upset, tearful, unable to function.  She took Xanax.  She saw her counselor earlier today.  Discussed. Off work x 2 weeks. RTC 2 weeks Xanax prn Start Trintellix tomorrow

## 2021-11-14 NOTE — Progress Notes (Signed)
Subjective:  Patient ID: Heather Patterson, female    DOB: 04-09-1975  Age: 46 y.o. MRN: [id number]  CC: Follow-up   HPI [name] presents for severe acute stress and acute grief reaction, anxiety. Daniah's boyfriend died suddenly on 2022/07/03 and her present.  He probably suffered a massive heart attack.  She started CPR immediately and called 911.  They tried to revive him for over an hour without success.  She is very upset, tearful, unable to function.  She took Xanax.  She saw her counselor earlier today.  Outpatient Medications Prior to Visit  Medication Sig Dispense Refill   ALPRAZolam (XANAX) 1 MG tablet Take 1 tablet (1 mg total) by mouth 2 (two) times daily as needed for anxiety. 60 tablet 1   Cholecalciferol (VITAMIN D3) 50 MCG (2000 UT) capsule Take 1 capsule (2,000 Units total) by mouth daily. 100 capsule 3   PARAGARD INTRAUTERINE COPPER IU by Intrauterine route.     No facility-administered medications prior to visit.    ROS: Review of Systems  Constitutional:  Negative for activity change, appetite change, chills, fatigue and unexpected weight change.  HENT:  Negative for congestion, mouth sores and sinus pressure.   Eyes:  Negative for visual disturbance.  Respiratory:  Negative for cough and chest tightness.   Cardiovascular:  Negative for palpitations.  Gastrointestinal:  Negative for abdominal pain and nausea.  Genitourinary:  Negative for difficulty urinating, frequency and vaginal pain.  Musculoskeletal:  Negative for back pain and gait problem.  Skin:  Negative for pallor and rash.  Neurological:  Negative for dizziness, tremors, weakness, numbness and headaches.  Psychiatric/Behavioral:  Positive for decreased concentration and dysphoric mood. Negative for agitation, behavioral problems, confusion, hallucinations, self-injury, sleep disturbance and suicidal ideas. The patient is nervous/anxious. The patient is not hyperactive.     Objective:  BP 110/82 (BP  Location: Left Arm)   Pulse 97   Temp 98.1 F (36.7 C) (Oral)   SpO2 98%   BP Readings from Last 3 Encounters:  11/14/21 110/82  11/10/21 110/72  05/18/21 120/80    Wt Readings from Last 3 Encounters:  11/10/21 170 lb 6.4 oz (77.3 kg)  12/28/20 168 lb (76.2 kg)  10/27/20 168 lb 9.6 oz (76.5 kg)    Physical Exam Constitutional:      General: She is in acute distress.     Appearance: She is well-developed.  HENT:     Head: Normocephalic.     Right Ear: External ear normal.     Left Ear: External ear normal.     Nose: Nose normal.  Eyes:     General:        Right eye: No discharge.        Left eye: No discharge.     Conjunctiva/sclera: Conjunctivae normal.     Pupils: Pupils are equal, round, and reactive to light.  Neck:     Thyroid: No thyromegaly.     Vascular: No JVD.     Trachea: No tracheal deviation.  Cardiovascular:     Rate and Rhythm: Normal rate and regular rhythm.     Heart sounds: Normal heart sounds.  Pulmonary:     Effort: No respiratory distress.     Breath sounds: No stridor. No wheezing.  Abdominal:     General: Bowel sounds are normal. There is no distension.     Palpations: Abdomen is soft. There is no mass.     Tenderness: There is no abdominal tenderness.  There is no guarding or rebound.  Musculoskeletal:        General: No tenderness.     Cervical back: Normal range of motion and neck supple. No rigidity.  Lymphadenopathy:     Cervical: No cervical adenopathy.  Skin:    Findings: No erythema or rash.  Neurological:     Cranial Nerves: No cranial nerve deficit.     Motor: No abnormal muscle tone.     Coordination: Coordination normal.     Deep Tendon Reflexes: Reflexes normal.  Psychiatric:        Behavior: Behavior normal.        Thought Content: Thought content normal.        Judgment: Judgment normal.   The patient is in acute emotional distress.  She is tearful and upset.  Lab Results  Component Value Date   WBC 6.9 11/10/2021    HGB 10.2 (L) 11/10/2021   HCT 32.2 (L) 11/10/2021   PLT 348.0 11/10/2021   GLUCOSE 90 11/10/2021   CHOL 178 11/10/2021   TRIG 73.0 11/10/2021   HDL 52.90 11/10/2021   LDLCALC 111 (H) 11/10/2021   ALT 14 11/10/2021   AST 16 11/10/2021   NA 138 11/10/2021   K 3.8 11/10/2021   CL 103 11/10/2021   CREATININE 0.91 11/10/2021   BUN 13 11/10/2021   CO2 27 11/10/2021   TSH 3.25 11/10/2021    MM DIAG BREAST TOMO BILATERAL  Result Date: 02/07/2021 CLINICAL DATA:  Palpable abnormality in the LEFT breast at 1 o'clock, 2 centimeters from the nipple. History of benign biopsy of the LEFT breast performed 10/25/2015 showing benign fibrocystic changes. EXAM: DIGITAL DIAGNOSTIC BILATERAL MAMMOGRAM WITH TOMOSYNTHESIS AND CAD; ULTRASOUND LEFT BREAST LIMITED TECHNIQUE: Bilateral digital diagnostic mammography and breast tomosynthesis was performed. The images were evaluated with computer-aided detection.; Targeted ultrasound examination of the left breast was performed. COMPARISON:  Previous exam(s). ACR Breast Density Category d: The breast tissue is extremely dense, which lowers the sensitivity of mammography. FINDINGS: RIGHT breast is negative. In the UPPER retroareolar region of the LEFT breast, there is a partially obscured oval mass further evaluated with ultrasound. On physical exam, there is a mobile superficial oval mass in the 1 o'clock retroareolar region of the LEFT breast. Targeted ultrasound is performed, showing a simple cyst in the 1 o'clock location of the LEFT breast 1 centimeter from the nipple measuring 1.8 x 1.9 x 1.3 centimeters. No internal blood flow or solid component identified. IMPRESSION: Benign cyst in the LEFT breast. No mammographic or ultrasound evidence for malignancy. RECOMMENDATION: Screening mammogram in one year.(Code:SM-B-01Y) I have discussed the findings and recommendations with the patient. If applicable, a reminder letter will be sent to the patient regarding the next  appointment. BI-RADS CATEGORY  2: Benign. Electronically Signed   By: Nolon Nations M.D.   On: 02/07/2021 15:39   US BREAST LTD UNI LEFT INC AXILLA  Result Date: 02/07/2021 CLINICAL DATA:  Palpable abnormality in the LEFT breast at 1 o'clock, 2 centimeters from the nipple. History of benign biopsy of the LEFT breast performed 10/25/2015 showing benign fibrocystic changes. EXAM: DIGITAL DIAGNOSTIC BILATERAL MAMMOGRAM WITH TOMOSYNTHESIS AND CAD; ULTRASOUND LEFT BREAST LIMITED TECHNIQUE: Bilateral digital diagnostic mammography and breast tomosynthesis was performed. The images were evaluated with computer-aided detection.; Targeted ultrasound examination of the left breast was performed. COMPARISON:  Previous exam(s). ACR Breast Density Category d: The breast tissue is extremely dense, which lowers the sensitivity of mammography. FINDINGS: RIGHT breast is negative. In  the UPPER retroareolar region of the LEFT breast, there is a partially obscured oval mass further evaluated with ultrasound. On physical exam, there is a mobile superficial oval mass in the 1 o'clock retroareolar region of the LEFT breast. Targeted ultrasound is performed, showing a simple cyst in the 1 o'clock location of the LEFT breast 1 centimeter from the nipple measuring 1.8 x 1.9 x 1.3 centimeters. No internal blood flow or solid component identified. IMPRESSION: Benign cyst in the LEFT breast. No mammographic or ultrasound evidence for malignancy. RECOMMENDATION: Screening mammogram in one year.(Code:SM-B-01Y) I have discussed the findings and recommendations with the patient. If applicable, a reminder letter will be sent to the patient regarding the next appointment. BI-RADS CATEGORY  2: Benign. Electronically Signed   By: Nolon Nations M.D.   On: 02/07/2021 15:39   Assessment & Plan:   Problem List Items Addressed This Visit     Grief reaction    New -severe acute stress and acute grief reaction, anxiety. Domanique's boyfriend  died suddenly on 07-08-2022 and her present.  He probably suffered a massive heart attack.  She started CPR immediately and called 911.  They tried to revive him for over an hour without success.  She is very upset, tearful, unable to function.  She took Xanax.  She saw her counselor earlier today.  Discussed. Off work x 2 weeks. RTC 2 weeks Xanax prn Start Trintellix tomorrow      Situational mixed anxiety and depressive disorder    11/14/21 -severe acute stress and acute grief reaction, anxiety. Jennilyn's boyfriend died suddenly on 07/08/2022 and her present.  He probably suffered a massive heart attack.  She started CPR immediately and called 911.  They tried to revive him for over an hour without success.  She is very upset, tearful, unable to function.  She took Xanax.  She saw her counselor earlier today.  Discussed. Off work x 2 weeks. RTC 2 weeks Xanax prn Start Trintellix tomorrow She will check in regard to [facility] paperwork      Relevant Medications   vortioxetine HBr (TRINTELLIX) 10 MG TABS tablet      Meds ordered this encounter  Medications   vortioxetine HBr (TRINTELLIX) 10 MG TABS tablet    Sig: Take 1 tablet (10 mg total) by mouth daily.    Dispense:  30 tablet    Refill:  5      Follow-up: Return in about 2 weeks (around 11/28/2021) for a follow-up visit.  Walker Kehr, MD

## 2021-11-14 NOTE — Assessment & Plan Note (Addendum)
11/14/21 -severe acute stress and acute grief reaction, anxiety. Heather Patterson's boyfriend died suddenly on 06-13-2022 and her present.  He probably suffered a massive heart attack.  She started CPR immediately and called 911.  They tried to revive him for over an hour without success.  She is very upset, tearful, unable to function.  She took Xanax.  She saw her counselor earlier today.  Discussed. Off work x 2 weeks. RTC 2 weeks Xanax prn Start Trintellix tomorrow She will check in regard to [facility] paperwork

## 2021-11-14 NOTE — Patient Instructions (Signed)
Stay off work for 2 weeks, then we will decide

## 2021-11-16 ENCOUNTER — Telehealth: Payer: Self-pay | Admitting: *Deleted

## 2021-11-16 NOTE — Telephone Encounter (Signed)
Pt need PA on Trintellix submitted w/  (Key: [id number]). PA sent to [facility]../l,mb

## 2021-11-18 ENCOUNTER — Other Ambulatory Visit (HOSPITAL_COMMUNITY): Payer: Self-pay

## 2021-11-18 NOTE — Telephone Encounter (Signed)
Rec'd determination med was APPROVED. Effective from 11/16/2021 through 11/15/2022. Fax approval to [place] pharm.Marland KitchenJohny Chess

## 2021-11-21 DIAGNOSIS — F339 Major depressive disorder, recurrent, unspecified: Secondary | ICD-10-CM | POA: Diagnosis not present

## 2021-11-29 ENCOUNTER — Other Ambulatory Visit (HOSPITAL_COMMUNITY): Payer: Self-pay

## 2021-12-08 ENCOUNTER — Other Ambulatory Visit (HOSPITAL_COMMUNITY): Payer: Self-pay

## 2021-12-08 DIAGNOSIS — F339 Major depressive disorder, recurrent, unspecified: Secondary | ICD-10-CM | POA: Diagnosis not present

## 2021-12-20 DIAGNOSIS — F339 Major depressive disorder, recurrent, unspecified: Secondary | ICD-10-CM | POA: Diagnosis not present

## 2022-01-04 DIAGNOSIS — F339 Major depressive disorder, recurrent, unspecified: Secondary | ICD-10-CM | POA: Diagnosis not present

## 2022-01-26 ENCOUNTER — Encounter: Payer: Self-pay | Admitting: Internal Medicine

## 2022-02-15 ENCOUNTER — Ambulatory Visit
Admission: RE | Admit: 2022-02-15 | Discharge: 2022-02-15 | Disposition: A | Discharge status: Home / Self Care | Payer: BC Managed Care – PPO | Source: Ambulatory Visit | Attending: Internal Medicine | Admitting: Internal Medicine

## 2022-02-15 DIAGNOSIS — E049 Nontoxic goiter, unspecified: Secondary | ICD-10-CM

## 2022-02-15 DIAGNOSIS — E041 Nontoxic single thyroid nodule: Secondary | ICD-10-CM | POA: Diagnosis not present

## 2022-05-09 ENCOUNTER — Other Ambulatory Visit (HOSPITAL_COMMUNITY): Payer: Self-pay

## 2022-08-17 ENCOUNTER — Encounter: Payer: Self-pay | Admitting: Obstetrics & Gynecology

## 2022-08-17 ENCOUNTER — Ambulatory Visit (INDEPENDENT_AMBULATORY_CARE_PROVIDER_SITE_OTHER): Payer: BC Managed Care – PPO | Admitting: Obstetrics & Gynecology

## 2022-08-17 VITALS — BP 130/82 | HR 103 | Ht 67.25 in | Wt 188.0 lb

## 2022-08-17 DIAGNOSIS — R635 Abnormal weight gain: Secondary | ICD-10-CM

## 2022-08-17 DIAGNOSIS — Z01419 Encounter for gynecological examination (general) (routine) without abnormal findings: Secondary | ICD-10-CM

## 2022-08-17 DIAGNOSIS — Z30431 Encounter for routine checking of intrauterine contraceptive device: Secondary | ICD-10-CM

## 2022-08-17 DIAGNOSIS — N8110 Cystocele, unspecified: Secondary | ICD-10-CM | POA: Diagnosis not present

## 2022-08-17 NOTE — Progress Notes (Signed)
FEDRA SATURNO May 03, 1975 [id number]   History:    47 y.o. G3P3L3 Divorced.  Children 12, 15, 74 yo.   RP:  Established patient presenting for annual gyn exam    HPI: ParaGard IUD x 02/2021.  Menses every month with normal flow. No breakthrough bleeding.  No pelvic pain. Sometimes feeling a vaginal pressure.  Not currently sexually active. Pap Neg 12/2020. Repeat at 3 years. Urine and bowel movements normal. Breasts normal. Mammo 01/2021 Rt Neg, Lt Dx mammo/US Neg.  Schedule Mammo. Schedule Colono.  Body mass index increased to 29.23.  Recommend low carb/calories, intermittent fasting and increased fitness activities.  Will do Health labs including TSH with Fam MD.     Past medical history,surgical history, family history and social history were all reviewed and documented in the EPIC chart.  Gynecologic History Patient's last menstrual period was 08/16/2022.  Obstetric History OB History  Gravida Para Term Preterm AB Living  3 3       3   SAB IAB Ectopic Multiple Live Births               # Outcome Date GA Lbr Len/2nd Weight Sex Delivery Anes PTL Lv  3 Para           2 Para           1 Para              ROS: A ROS was performed and pertinent positives and negatives are included in the history. GENERAL: No fevers or chills. HEENT: No change in vision, no earache, sore throat or sinus congestion. NECK: No pain or stiffness. CARDIOVASCULAR: No chest pain or pressure. No palpitations. PULMONARY: No shortness of breath, cough or wheeze. GASTROINTESTINAL: No abdominal pain, nausea, vomiting or diarrhea, melena or bright red blood per rectum. GENITOURINARY: No urinary frequency, urgency, hesitancy or dysuria. MUSCULOSKELETAL: No joint or muscle pain, no back pain, no recent trauma. DERMATOLOGIC: No rash, no itching, no lesions. ENDOCRINE: No polyuria, polydipsia, no heat or cold intolerance. No recent change in weight. HEMATOLOGICAL: No anemia or easy bruising or bleeding. NEUROLOGIC: No  headache, seizures, numbness, tingling or weakness. PSYCHIATRIC: No depression, no loss of interest in normal activity or change in sleep pattern.     Exam:   BP 130/82   Pulse (!) 103   Ht 5' 7.25" (1.708 m)   Wt 188 lb (85.3 kg)   LMP 08/16/2022 Comment: paragard inserted 03-25-21, not sexually active  SpO2 98%   BMI 29.23 kg/m   Body mass index is 29.23 kg/m.  General appearance : Well developed well nourished female. No acute distress HEENT: Eyes: no retinal hemorrhage or exudates,  Neck supple, trachea midline, no carotid bruits, no thyroidmegaly Lungs: Clear to auscultation, no rhonchi or wheezes, or rib retractions  Heart: Regular rate and rhythm, no murmurs or gallops Breast:Examined in sitting and supine position were symmetrical in appearance, no palpable masses or tenderness,  no skin retraction, no nipple inversion, no nipple discharge, no skin discoloration, no axillary or supraclavicular lymphadenopathy Abdomen: no palpable masses or tenderness, no rebound or guarding Extremities: no edema or skin discoloration or tenderness  Pelvic: Vulva: Normal             Vagina: No gross lesions or discharge.  Cystocele grade 2/4 with Valsalva in standing position.  Cervix: No gross lesions or discharge.  IUD strings felt at [facility].  Uterus  RV, normal size, shape and consistency, non-tender and mobile  Adnexa  Without masses or tenderness  Anus: Normal   Assessment/Plan:  47 y.o. female for annual exam   1. Well female exam with routine gynecological exam ParaGard IUD x 02/2021.  Menses every month with normal flow. No breakthrough bleeding.  No pelvic pain. Sometimes feeling a vaginal pressure.  Not currently sexually active. Pap Neg 12/2020. Repeat at 3 years. Urine and bowel movements normal. Breasts normal. Mammo 01/2021 Rt Neg, Lt Dx mammo/US Neg.  Schedule Mammo. Schedule Colono.  Body mass index increased to 29.23.  Recommend low carb/calories, intermittent fasting and  increased fitness activities.  Will do Health labs including TSH with Fam MD.    2. Encounter for routine checking of intrauterine contraceptive device (IUD) ParaGard IUD x 02/2021.  Menses every month with normal flow. No breakthrough bleeding.  No pelvic pain. IUD in good position.  3. Baden-Walker grade 2 cystocele Cystocele grade 2/4 with Valsalva in standing position.  Importance of emptying her bladder regularly before it overfills and avoiding pelvic floor pressure to limit progression.  Kegel exercises instructed and recommended.  4. Weight gain  Body mass index increased to 29.23.  Recommend low carb/calories, intermittent fasting and increased fitness activities.  Will do Health labs including TSH with Fam MD.    Genia Del MD, 2:02 PM

## 2023-01-03 ENCOUNTER — Ambulatory Visit: Payer: BC Managed Care – PPO | Admitting: Internal Medicine

## 2023-01-03 ENCOUNTER — Other Ambulatory Visit (HOSPITAL_COMMUNITY): Payer: Self-pay

## 2023-01-03 ENCOUNTER — Encounter: Payer: Self-pay | Admitting: Internal Medicine

## 2023-01-03 VITALS — BP 118/82 | HR 83 | Temp 98.5°F | Ht 67.5 in | Wt 193.0 lb

## 2023-01-03 DIAGNOSIS — F4323 Adjustment disorder with mixed anxiety and depressed mood: Secondary | ICD-10-CM | POA: Diagnosis not present

## 2023-01-03 DIAGNOSIS — Z0001 Encounter for general adult medical examination with abnormal findings: Secondary | ICD-10-CM | POA: Diagnosis not present

## 2023-01-03 DIAGNOSIS — R5383 Other fatigue: Secondary | ICD-10-CM

## 2023-01-03 DIAGNOSIS — Z13220 Encounter for screening for lipoid disorders: Secondary | ICD-10-CM

## 2023-01-03 DIAGNOSIS — Z0000 Encounter for general adult medical examination without abnormal findings: Secondary | ICD-10-CM

## 2023-01-03 LAB — COMPREHENSIVE METABOLIC PANEL
ALT: 17 U/L (ref 0–35)
AST: 17 U/L (ref 0–37)
Albumin: 4.2 g/dL (ref 3.5–5.2)
Alkaline Phosphatase: 44 U/L (ref 39–117)
BUN: 9 mg/dL (ref 6–23)
CO2: 26 meq/L (ref 19–32)
Calcium: 9.3 mg/dL (ref 8.4–10.5)
Chloride: 102 meq/L (ref 96–112)
Creatinine, Ser: 0.82 mg/dL (ref 0.40–1.20)
GFR: 85.2 mL/min (ref >60.00)
Glucose, Bld: 93 mg/dL (ref 70–99)
Potassium: 3.6 meq/L (ref 3.5–5.1)
Sodium: 136 meq/L (ref 135–145)
Total Bilirubin: 0.5 mg/dL (ref 0.2–1.2)
Total Protein: 7.5 g/dL (ref 6.0–8.3)

## 2023-01-03 LAB — CBC WITH DIFFERENTIAL/PLATELET
Basophils Absolute: 0.1 10*3/uL (ref 0.0–0.1)
Basophils Relative: 0.6 % (ref 0.0–3.0)
Eosinophils Absolute: 0.2 10*3/uL (ref 0.0–0.7)
Eosinophils Relative: 1.8 % (ref 0.0–5.0)
HCT: 33.3 % — ABNORMAL LOW (ref 36.0–46.0)
Hemoglobin: 10.4 g/dL — ABNORMAL LOW (ref 12.0–15.0)
Lymphocytes Relative: 29.2 % (ref 12.0–46.0)
Lymphs Abs: 2.7 10*3/uL (ref 0.7–4.0)
MCHC: 31.3 g/dL (ref 30.0–36.0)
MCV: 72.4 fL — ABNORMAL LOW (ref 78.0–100.0)
Monocytes Absolute: 0.8 10*3/uL (ref 0.1–1.0)
Monocytes Relative: 8.9 % (ref 3.0–12.0)
Neutro Abs: 5.5 10*3/uL (ref 1.4–7.7)
Neutrophils Relative %: 59.5 % (ref 43.0–77.0)
Platelets: 351 10*3/uL (ref 150.0–400.0)
RBC: 4.6 Mil/uL (ref 3.87–5.11)
RDW: 16.9 % — ABNORMAL HIGH (ref 11.5–15.5)
WBC: 9.2 10*3/uL (ref 4.0–10.5)

## 2023-01-03 LAB — LIPID PANEL
Cholesterol: 198 mg/dL (ref 0–200)
HDL: 49.4 mg/dL (ref >39.00)
LDL Cholesterol: 124 mg/dL — ABNORMAL HIGH (ref 0–99)
NonHDL: 148.2
Total CHOL/HDL Ratio: 4
Triglycerides: 121 mg/dL (ref 0.0–149.0)
VLDL: 24.2 mg/dL (ref 0.0–40.0)

## 2023-01-03 LAB — T3, FREE: T3, Free: 3.7 pg/mL (ref 2.3–4.2)

## 2023-01-03 LAB — TSH: TSH: 5.11 u[IU]/mL (ref 0.35–5.50)

## 2023-01-03 LAB — T4, FREE: Free T4: 0.76 ng/dL (ref 0.60–1.60)

## 2023-01-03 MED ORDER — ALPRAZOLAM 1 MG PO TABS
1.0000 mg | ORAL_TABLET | Freq: Two times a day (BID) | ORAL | 1 refills | Status: DC | PRN
  Filled 2023-01-03 – 2023-02-19 (×2): qty 60, 30d supply, fill #0

## 2023-01-03 NOTE — Assessment & Plan Note (Signed)
4/16 B

## 2023-01-03 NOTE — Progress Notes (Signed)
Subjective:  Patient ID: Heather Patterson, female    DOB: 09-21-1975  Age: 47 y.o. MRN: [id number]  CC: No chief complaint on file.   HPI Heather Patterson presents for a well exam  Outpatient Medications Prior to Visit  Medication Sig Dispense Refill   Cholecalciferol (VITAMIN D3) 50 MCG (2000 UT) capsule Take 1 capsule (2,000 Units total) by mouth daily. 100 capsule 3   PARAGARD INTRAUTERINE COPPER IU by Intrauterine route.     ALPRAZolam (XANAX) 1 MG tablet Take 1 tablet (1 mg total) by mouth 2 (two) times daily as needed for anxiety. 60 tablet 1   No facility-administered medications prior to visit.    ROS: Review of Systems  Constitutional:  Positive for fatigue. Negative for activity change, appetite change, chills and unexpected weight change.  HENT:  Negative for congestion, mouth sores and sinus pressure.   Eyes:  Negative for visual disturbance.  Respiratory:  Negative for cough and chest tightness.   Gastrointestinal:  Negative for abdominal pain and nausea.  Genitourinary:  Negative for difficulty urinating, frequency and vaginal pain.  Musculoskeletal:  Negative for back pain and gait problem.  Skin:  Negative for pallor and rash.  Neurological:  Negative for dizziness, tremors, weakness, numbness and headaches.  Psychiatric/Behavioral:  Negative for confusion and sleep disturbance.     Objective:  BP 118/82 (BP Location: Left Arm, Patient Position: Sitting, Cuff Size: Normal)   Pulse 83   Temp 98.5 F (36.9 C) (Oral)   Ht 5' 7.5" (1.715 m)   Wt 193 lb (87.5 kg)   SpO2 99%   BMI 29.78 kg/m   BP Readings from Last 3 Encounters:  01/03/23 118/82  08/17/22 130/82  11/14/21 110/82    Wt Readings from Last 3 Encounters:  01/03/23 193 lb (87.5 kg)  08/17/22 188 lb (85.3 kg)  11/10/21 170 lb 6.4 oz (77.3 kg)    Physical Exam Constitutional:      General: She is not in acute distress.    Appearance: She is well-developed.  HENT:     Head: Normocephalic.      Right Ear: External ear normal.     Left Ear: External ear normal.     Nose: Nose normal.  Eyes:     General:        Right eye: No discharge.        Left eye: No discharge.     Conjunctiva/sclera: Conjunctivae normal.     Pupils: Pupils are equal, round, and reactive to light.  Neck:     Thyroid: No thyromegaly.     Vascular: No JVD.     Trachea: No tracheal deviation.  Cardiovascular:     Rate and Rhythm: Normal rate and regular rhythm.     Heart sounds: Normal heart sounds.  Pulmonary:     Effort: No respiratory distress.     Breath sounds: No stridor. No wheezing.  Abdominal:     General: Bowel sounds are normal. There is no distension.     Palpations: Abdomen is soft. There is no mass.     Tenderness: There is no abdominal tenderness. There is no guarding or rebound.  Musculoskeletal:        General: No tenderness.     Cervical back: Normal range of motion and neck supple. No rigidity.  Lymphadenopathy:     Cervical: No cervical adenopathy.  Skin:    Findings: No erythema or rash.  Neurological:     Cranial Nerves: No  cranial nerve deficit.     Motor: No abnormal muscle tone.     Coordination: Coordination normal.     Deep Tendon Reflexes: Reflexes normal.  Psychiatric:        Behavior: Behavior normal.        Thought Content: Thought content normal.        Judgment: Judgment normal.     Lab Results  Component Value Date   WBC 9.2 01/03/2023   HGB 10.4 (L) 01/03/2023   HCT 33.3 (L) 01/03/2023   PLT 351.0 01/03/2023   GLUCOSE 93 01/03/2023   CHOL 198 01/03/2023   TRIG 121.0 01/03/2023   HDL 49.40 01/03/2023   LDLCALC 124 (H) 01/03/2023   ALT 17 01/03/2023   AST 17 01/03/2023   NA 136 01/03/2023   K 3.6 01/03/2023   CL 102 01/03/2023   CREATININE 0.82 01/03/2023   BUN 9 01/03/2023   CO2 26 01/03/2023   TSH 5.11 01/03/2023    US THYROID  Result Date: 02/16/2022 CLINICAL DATA:  Palpable abnormality. Thyromegaly on physical examination. EXAM: THYROID  ULTRASOUND TECHNIQUE: Ultrasound examination of the thyroid gland and adjacent soft tissues was performed. COMPARISON:  None Available. FINDINGS: Parenchymal Echotexture: Moderately heterogenous mild diffuse glandular hyperemia (images 23 and 46) Isthmus: Normal in size measuring 0.5 cm in diameter Right lobe: Borderline enlarged measuring 5.8 x 1.8 x 1.6 cm Left lobe: Borderline enlarged measuring 5.6 x 1.8 x 1.4 cm _________________________________________________________ Estimated total number of nodules >/= 1 cm: 1 Number of spongiform nodules >/=  2 cm not described below (TR1): 0 Number of mixed cystic and solid nodules >/= 1.5 cm not described below (TR2): 0 _________________________________________________________ There is an approximately 0.7 x 0.4 x 0.4 cm hypoechoic nodule adjacent to the inferior pole of the right lobe of the thyroid (labeled 1), potentially representative of a non pathologically enlarged lymph node though conceivably a parathyroid adenoma could have a similar appearance. _________________________________________________________ There is an approximately 0.9 x 0.6 x 0.4 cm hypoechoic nodule within the inferior pole of the right lobe of the thyroid (labeled 2), which does not meet criteria to recommend percutaneous sampling or continued dedicated follow-up. _________________________________________________________ Nodule # 3: Location: Left; Mid Maximum size: 1.6 cm; Other 2 dimensions: 1.3 x 1.0 cm Composition: solid/almost completely solid (2) Echogenicity: isoechoic (1) Shape: not taller-than-wide (0) Margins: smooth (0) Echogenic foci: none (0) ACR TI-RADS total points: 3. ACR TI-RADS risk category: TR3 (3 points). ACR TI-RADS recommendations: *Given size (>/= 1.5 - 2.4 cm) and appearance, a follow-up ultrasound in 1 year should be considered based on TI-RADS criteria. _________________________________________________________ IMPRESSION: 1. Moderately heterogeneous and borderline  enlarged thyroid with potential diffuse glandular hyperemia, nonspecific though could be seen in the setting of a thyroiditis. Clinical correlation advised. 2. Nodule labeled #3 meets imaging criteria to recommend a 1 year follow-up as indicated. 3. Punctate (approximately 0.7 cm) hypoechoic nodule adjacent to the inferior pole of the right lobe of the thyroid may represent a non pathologically enlarged cervical lymph node versus a parathyroid adenoma (favored). Clinical correlation is advised. Further evaluation with nuclear medicine parathyroid scintigraphy could be performed as indicated. The above is in keeping with the ACR TI-RADS recommendations - J Am Coll Radiol 2017;14:587-595. Electronically Signed   By: Simonne Come M.D.   On: 02/16/2022 11:01    Assessment & Plan:   Problem List Items Addressed This Visit     Well adult exam - Primary    We discussed age appropriate health related  issues, including available/recomended screening tests and vaccinations. Labs were ordered to be later reviewed . All questions were answered. We discussed one or more of the following - seat belt use, use of sunscreen/sun exposure exercise, fall risk reduction, second hand smoke exposure, firearm use and storage, seat belt use, a need for adhering to healthy diet and exercise. Labs were ordered.  All questions were answered. Thyroid enlargement noted Colonoscopy or Cologuard suggested      Relevant Orders   TSH (Completed)   Urinalysis (Completed)   CBC with Differential/Platelet (Completed)   Lipid panel (Completed)   Comprehensive metabolic panel (Completed)   T3, free (Completed)   T4, free (Completed)   Situational mixed anxiety and depressive disorder     Xanax prn -rare use       Relevant Medications   ALPRAZolam (XANAX) 1 MG tablet   Fatigue    Obtain lab work including free T4, B12 Rest more      Relevant Orders   T3, free (Completed)   T4, free (Completed)      Meds ordered this  encounter  Medications   ALPRAZolam (XANAX) 1 MG tablet    Sig: Take 1 tablet (1 mg total) by mouth 2 (two) times daily as needed for anxiety.    Dispense:  60 tablet    Refill:  1      Follow-up: Return in about 3 months (around 04/05/2023) for a follow-up visit.  Sonda Primes, MD

## 2023-01-04 LAB — URINALYSIS
Bilirubin Urine: NEGATIVE
Hgb urine dipstick: NEGATIVE
Ketones, ur: NEGATIVE
Leukocytes,Ua: NEGATIVE
Nitrite: NEGATIVE
Specific Gravity, Urine: 1.01 (ref 1.000–1.030)
Total Protein, Urine: NEGATIVE
Urine Glucose: NEGATIVE
Urobilinogen, UA: 0.2 (ref 0.0–1.0)
pH: 5.5 (ref 5.0–8.0)

## 2023-01-15 ENCOUNTER — Other Ambulatory Visit (HOSPITAL_COMMUNITY): Payer: Self-pay

## 2023-01-28 NOTE — Assessment & Plan Note (Signed)
Obtain lab work including free T4, B12 Rest more

## 2023-01-28 NOTE — Assessment & Plan Note (Signed)
  Xanax prn -rare use

## 2023-01-28 NOTE — Assessment & Plan Note (Signed)
We discussed age appropriate health related issues, including available/recomended screening tests and vaccinations. Labs were ordered to be later reviewed . All questions were answered. We discussed one or more of the following - seat belt use, use of sunscreen/sun exposure exercise, fall risk reduction, second hand smoke exposure, firearm use and storage, seat belt use, a need for adhering to healthy diet and exercise. Labs were ordered.  All questions were answered. Thyroid enlargement noted Colonoscopy or Cologuard suggested

## 2023-01-30 IMAGING — US US BREAST*L* LIMITED INC AXILLA
1 series · 5 of 5 positions shown · non-contrast
Comparison: Previous exam(s).

CLINICAL DATA: Palpable abnormality in the LEFT breast at 1
o'clock, 2 centimeters from the nipple. History of benign biopsy of
the LEFT breast performed 10/25/2015 showing benign fibrocystic
changes.

EXAM:
DIGITAL DIAGNOSTIC BILATERAL MAMMOGRAM WITH TOMOSYNTHESIS AND CAD;
ULTRASOUND LEFT BREAST LIMITED
TECHNIQUE: Bilateral digital diagnostic mammography and breast tomosynthesis
was performed. The images were evaluated with computer-aided
detection.; Targeted ultrasound examination of the left breast was
performed.

[Series 1: us breast*left* limited inc axilla · 0.07mm/px · 5 of 5 slices shown]
[im 1/5]
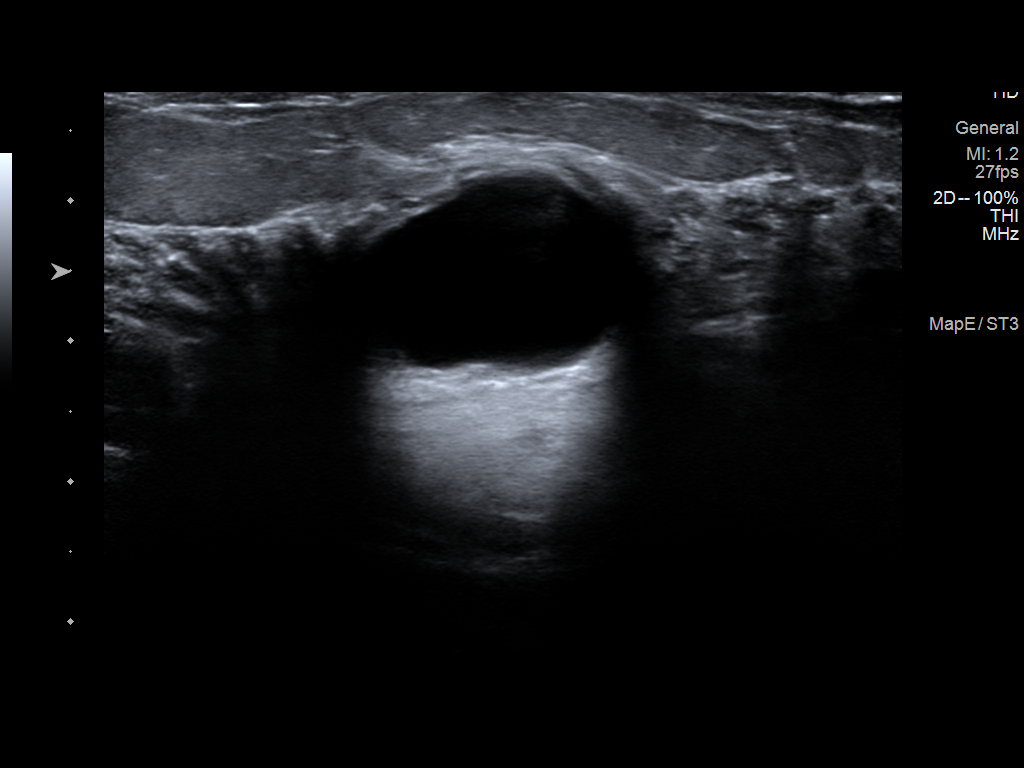
[im 2/5]
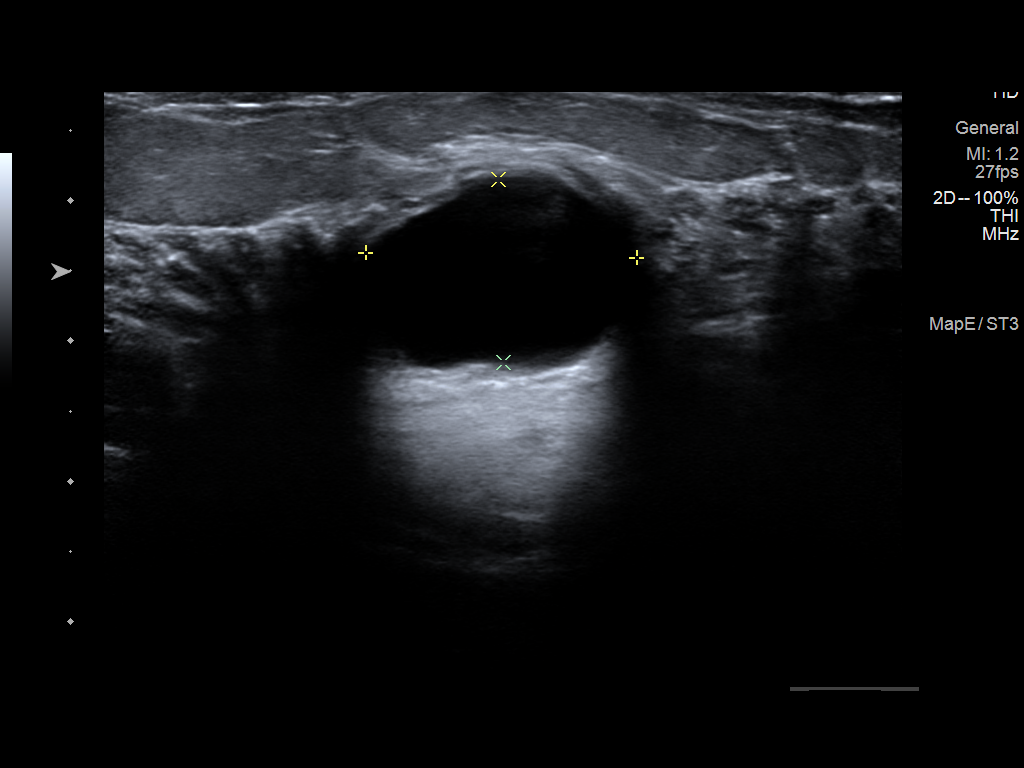
[im 3/5]
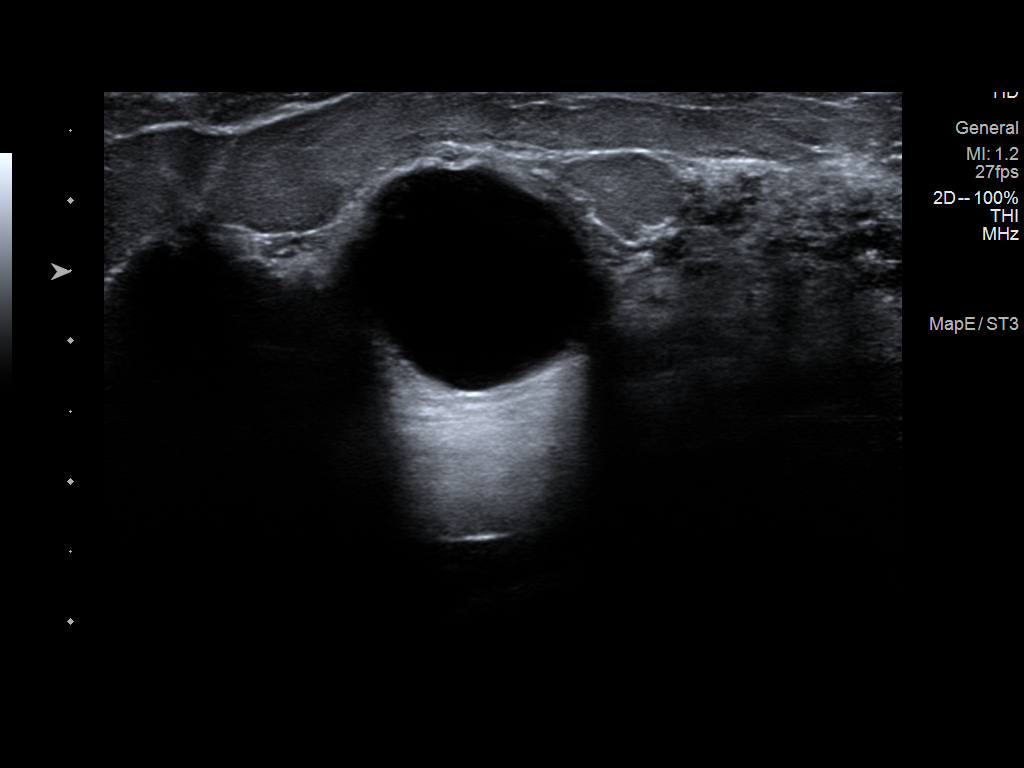
[im 4/5]
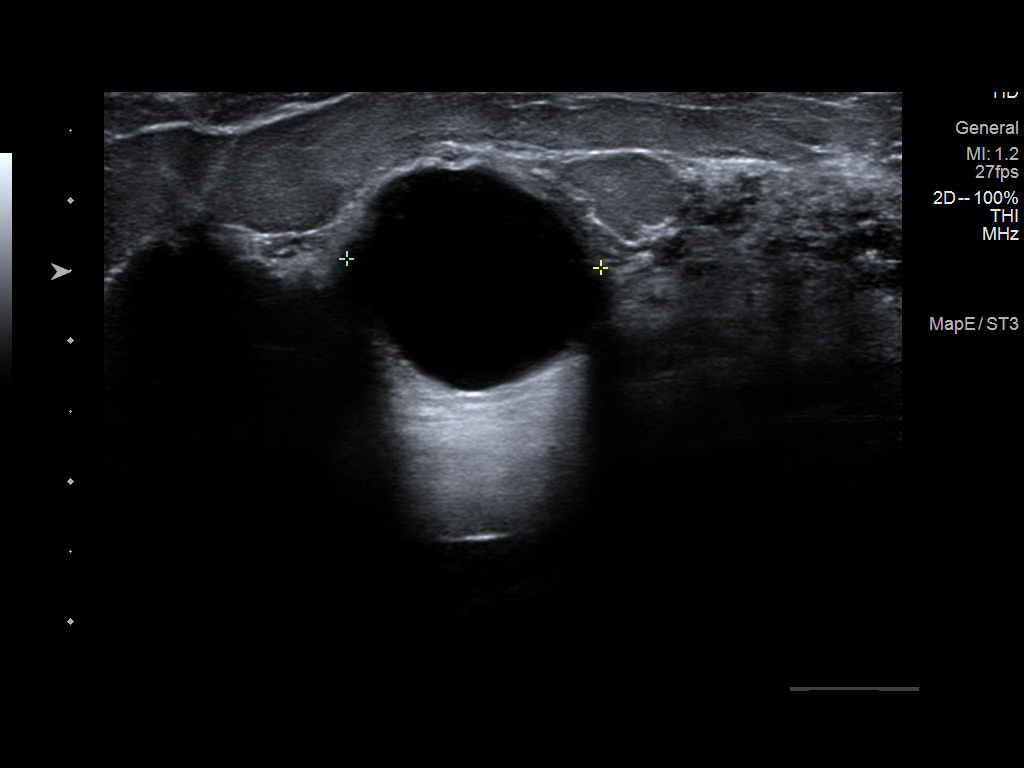
[im 5/5]
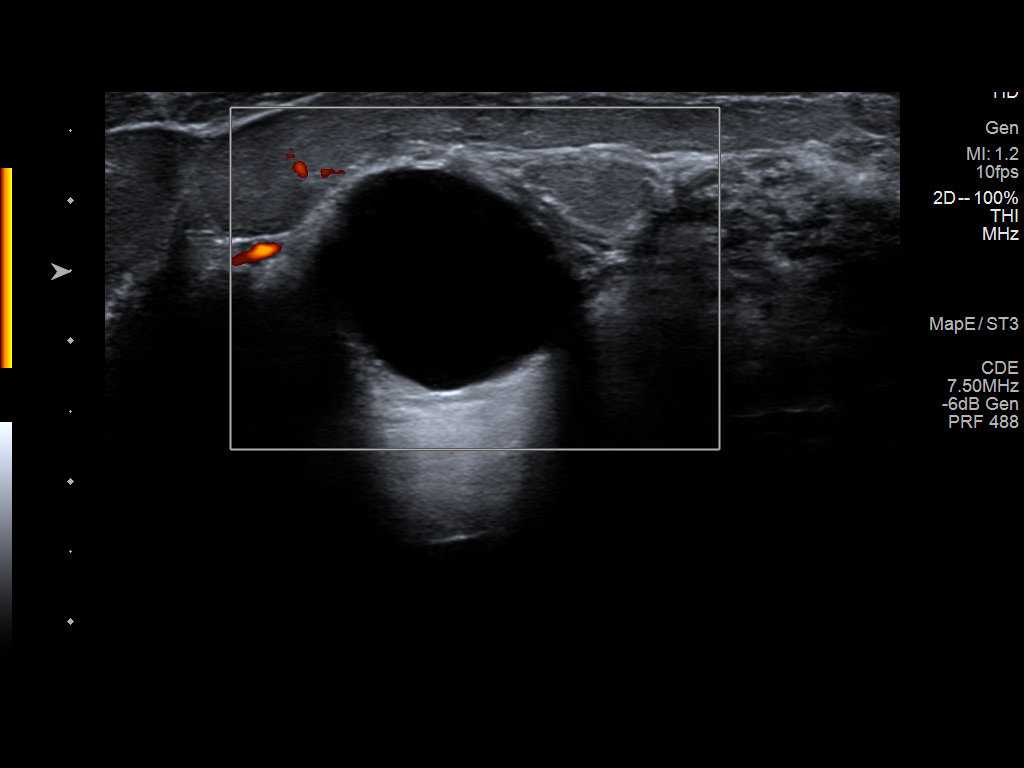

[5 of 5 positions shown; findings below may reference images not displayed]

ACR Breast Density Category d: The breast tissue is extremely dense,
which lowers the sensitivity of mammography.
FINDINGS: RIGHT breast is negative. In the UPPER retroareolar region of the
LEFT breast, there is a partially obscured oval mass further
evaluated with ultrasound.

On physical exam, there is a mobile superficial oval mass in the 1
o'clock retroareolar region of the LEFT breast.

Targeted ultrasound is performed, showing a simple cyst in the 1
o'clock location of the LEFT breast 1 centimeter from the nipple
measuring 1.8 x 1.9 x 1.3 centimeters. No internal blood flow or
solid component identified.
IMPRESSION: Benign cyst in the LEFT breast. No mammographic or ultrasound
evidence for malignancy.

RECOMMENDATION:
Screening mammogram in one year.(Code:NH-E-KCF)

I have discussed the findings and recommendations with the patient.
If applicable, a reminder letter will be sent to the patient
regarding the next appointment.

BI-RADS CATEGORY  2: Benign.

## 2023-01-30 IMAGING — MG DIGITAL DIAGNOSTIC BILAT W/ TOMO W/ CAD
8 series · 8 of 24 positions shown · non-contrast
Comparison: Previous exam(s).

CLINICAL DATA: Palpable abnormality in the LEFT breast at 1
o'clock, 2 centimeters from the nipple. History of benign biopsy of
the LEFT breast performed 10/25/2015 showing benign fibrocystic
changes.

EXAM:
DIGITAL DIAGNOSTIC BILATERAL MAMMOGRAM WITH TOMOSYNTHESIS AND CAD;
ULTRASOUND LEFT BREAST LIMITED
TECHNIQUE: Bilateral digital diagnostic mammography and breast tomosynthesis
was performed. The images were evaluated with computer-aided
detection.; Targeted ultrasound examination of the left breast was
performed.

[R CC synth-2D]
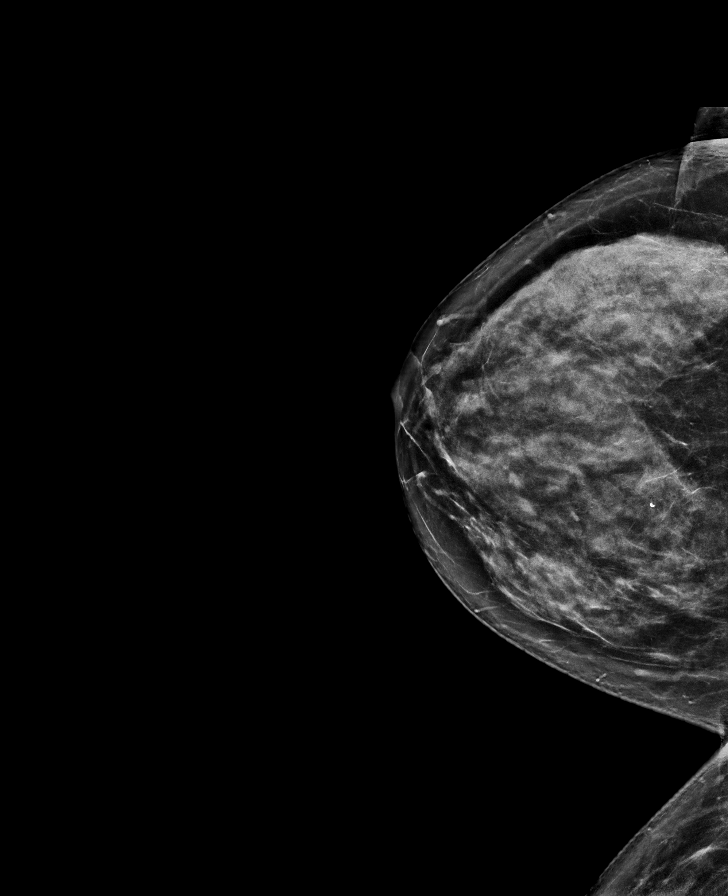

[L CC synth-2D]
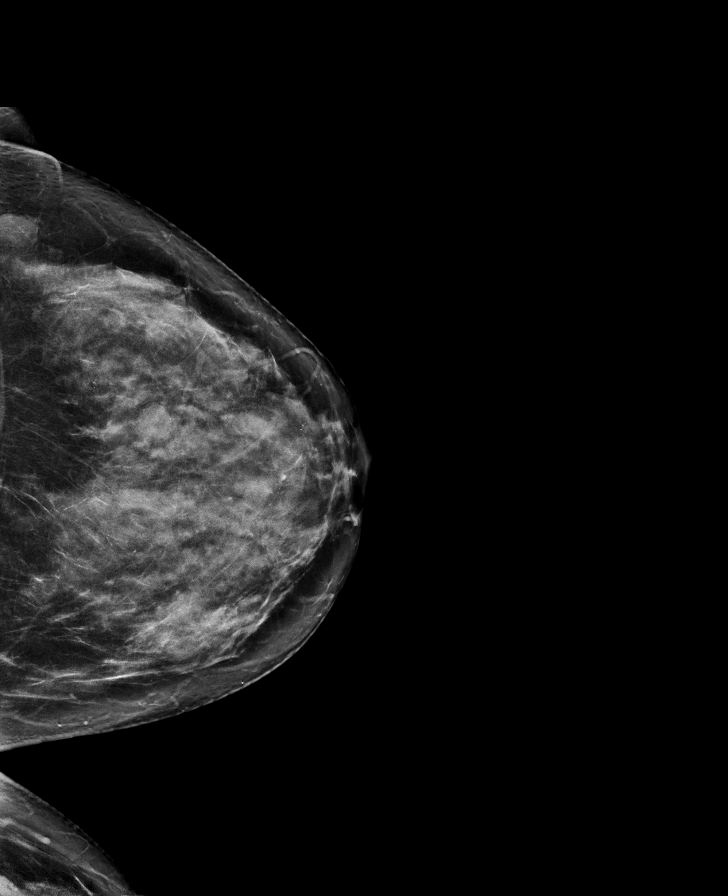

[R MLO synth-2D]
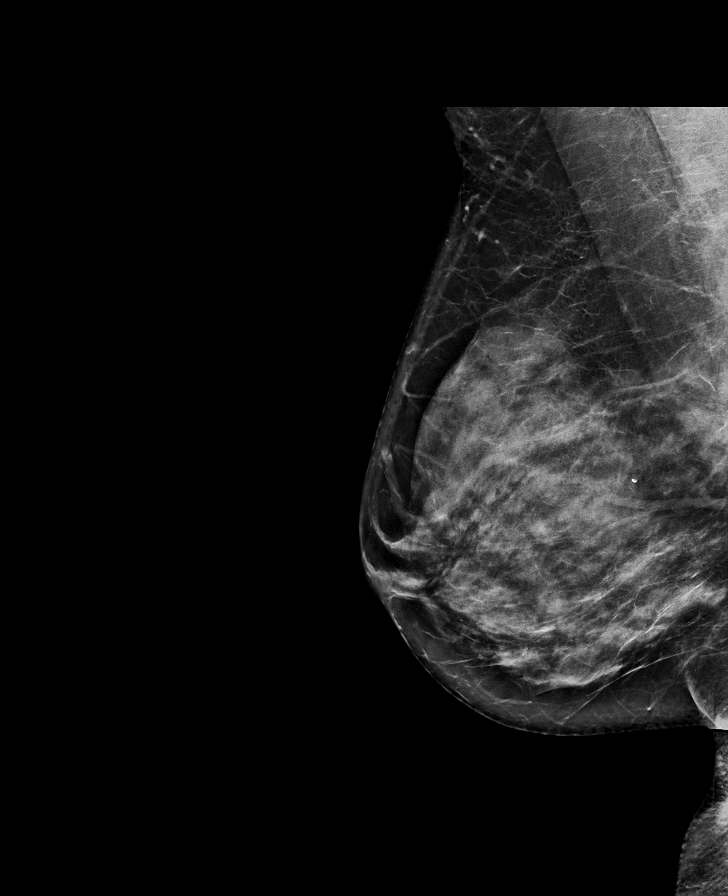

[L MLO synth-2D]
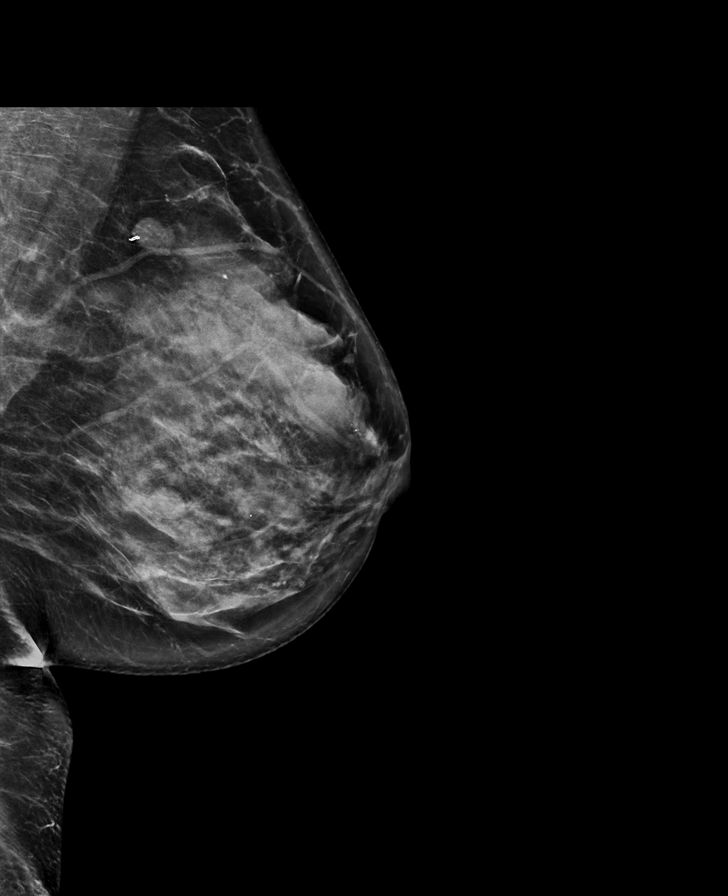

[L MLO tomo · tomo slice 38/75.0]
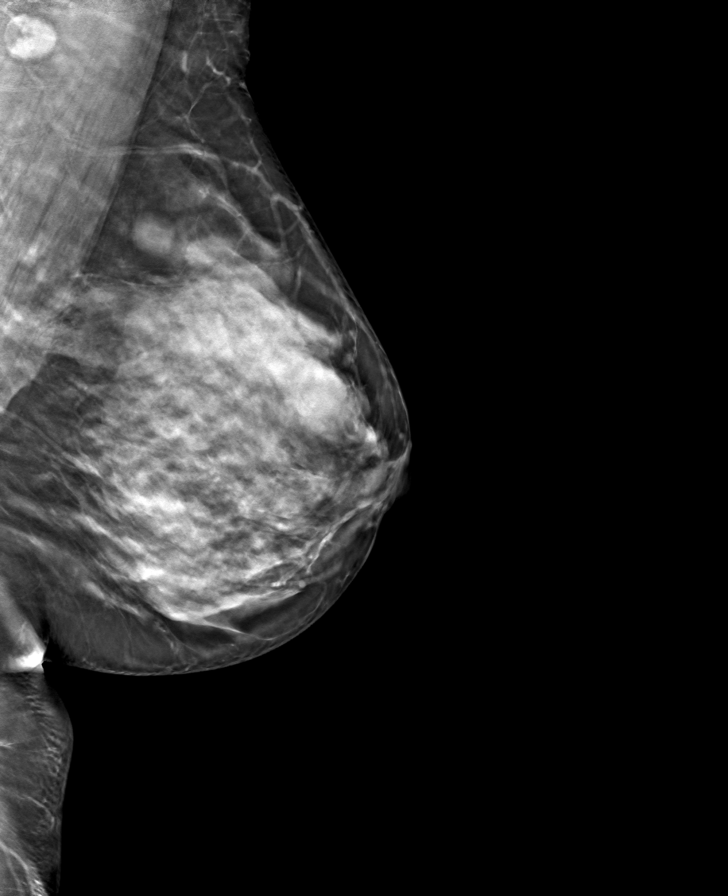

[R MLO tomo · tomo slice 41/81.0]
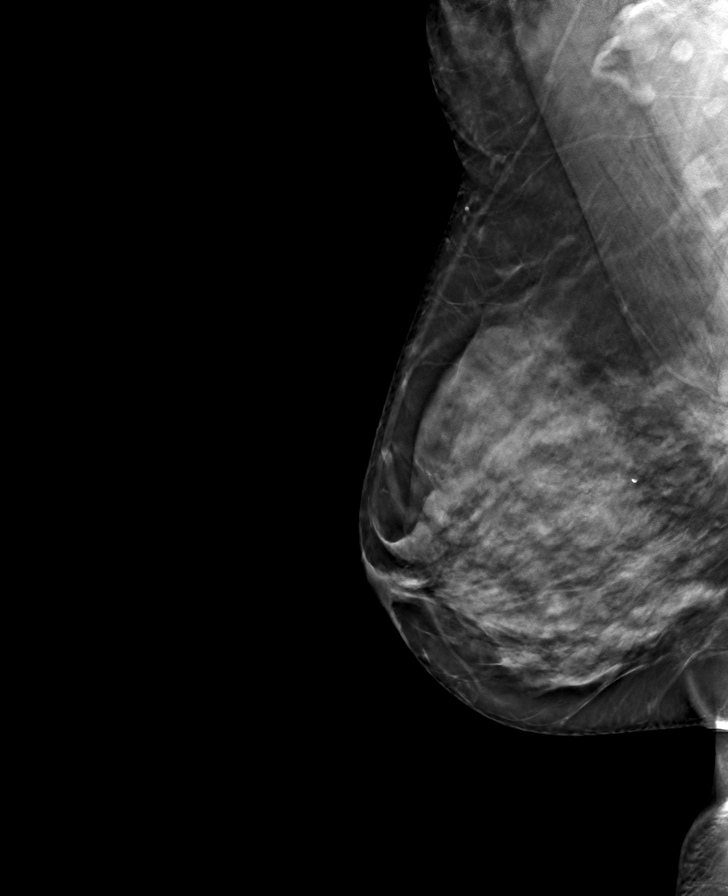

[L CC tomo · tomo slice 39/76.0]
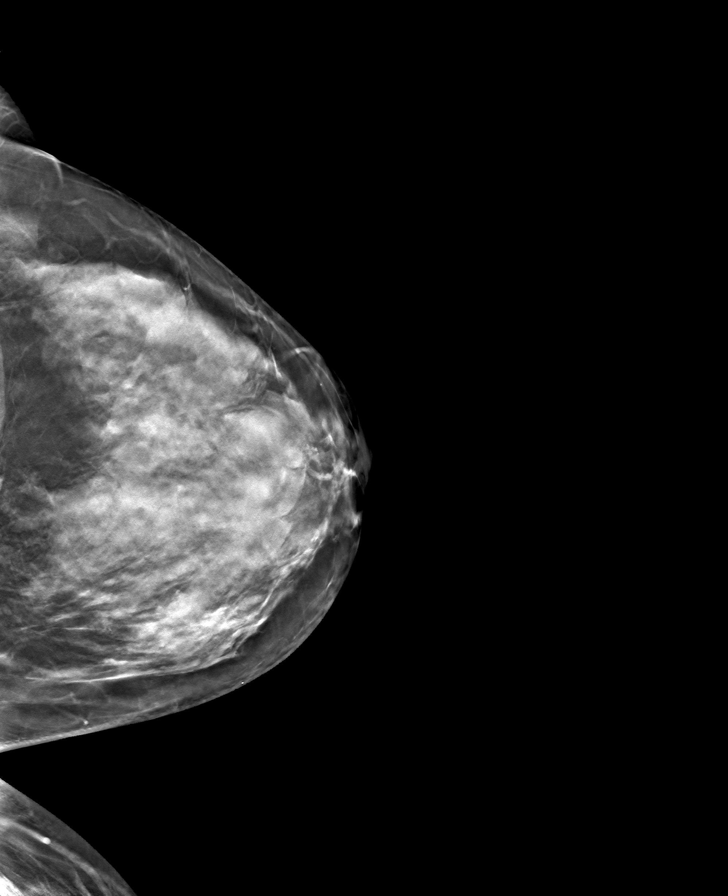

[R CC tomo · tomo slice 37/74.0]
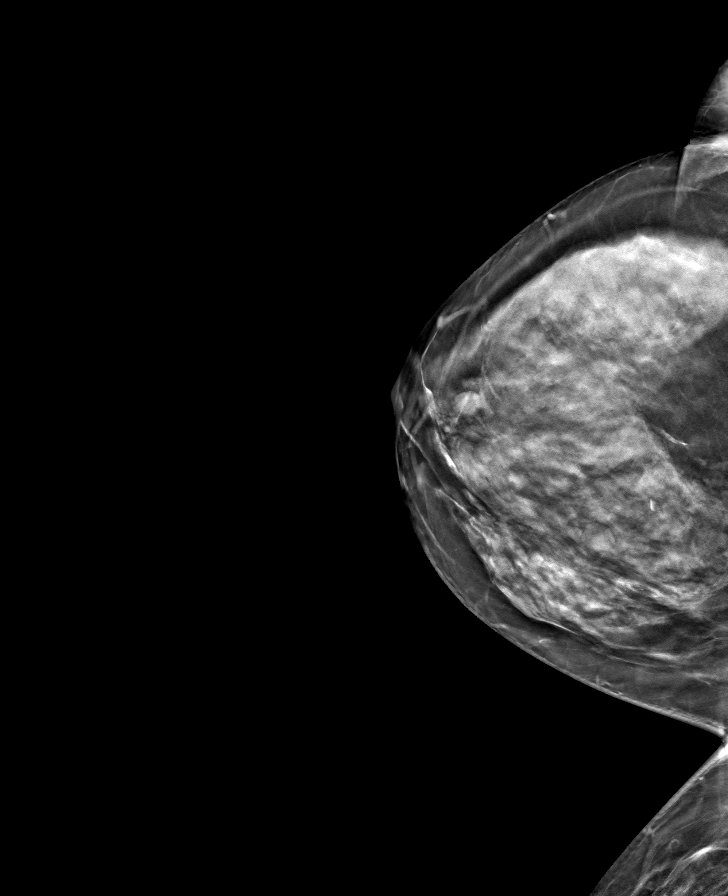

[8 of 24 positions shown; findings below may reference images not displayed]

ACR Breast Density Category d: The breast tissue is extremely dense,
which lowers the sensitivity of mammography.
FINDINGS: RIGHT breast is negative. In the UPPER retroareolar region of the
LEFT breast, there is a partially obscured oval mass further
evaluated with ultrasound.

On physical exam, there is a mobile superficial oval mass in the 1
o'clock retroareolar region of the LEFT breast.

Targeted ultrasound is performed, showing a simple cyst in the 1
o'clock location of the LEFT breast 1 centimeter from the nipple
measuring 1.8 x 1.9 x 1.3 centimeters. No internal blood flow or
solid component identified.
IMPRESSION: Benign cyst in the LEFT breast. No mammographic or ultrasound
evidence for malignancy.

RECOMMENDATION:
Screening mammogram in one year.(Code:NH-E-KCF)

I have discussed the findings and recommendations with the patient.
If applicable, a reminder letter will be sent to the patient
regarding the next appointment.

BI-RADS CATEGORY  2: Benign.

## 2023-02-19 ENCOUNTER — Other Ambulatory Visit (HOSPITAL_COMMUNITY): Payer: Self-pay

## 2023-10-24 ENCOUNTER — Other Ambulatory Visit: Payer: Self-pay

## 2023-11-09 ENCOUNTER — Other Ambulatory Visit (HOSPITAL_COMMUNITY): Payer: Self-pay

## 2023-11-09 ENCOUNTER — Ambulatory Visit: Admitting: Radiology

## 2023-11-09 ENCOUNTER — Encounter: Payer: Self-pay | Admitting: Radiology

## 2023-11-09 VITALS — BP 124/80 | HR 87 | Wt 188.0 lb

## 2023-11-09 DIAGNOSIS — N951 Menopausal and female climacteric states: Secondary | ICD-10-CM | POA: Diagnosis not present

## 2023-11-09 MED ORDER — PROGESTERONE MICRONIZED 100 MG PO CAPS
100.0000 mg | ORAL_CAPSULE | Freq: Every evening | ORAL | 1 refills | Status: DC
  Filled 2023-11-09 (×2): qty 90, 90d supply, fill #0

## 2023-11-09 MED ORDER — ESTRADIOL 0.025 MG/24HR TD PTTW
1.0000 | MEDICATED_PATCH | TRANSDERMAL | 1 refills | Status: DC
  Filled 2023-11-09 (×2): qty 24, 84d supply, fill #0

## 2023-11-09 NOTE — Progress Notes (Signed)
   FLORINE SPRENKLE 06/17/1975 [id number]   History:  48 y.o. G3P3 c/o hot flashes, dryness (skin, hair, vagina), trouble sleeping, increased fatigue, skipping menses, weight gain. Symptoms x's few years, progressively worse x's 2 months. Would like to start HRT.  Gynecologic History Patient's last menstrual period was 10/23/2023 (exact date).   Contraception/Family planning: IUD  Obstetric History OB History  Gravida Para Term Preterm AB Living  3 3    3   SAB IAB Ectopic Multiple Live Births          # Outcome Date GA Lbr Len/2nd Weight Sex Type Anes PTL Lv  3 Para           2 Para           1 Para                The following portions of the patient's history were reviewed and updated as appropriate: allergies, current medications, past family history, past medical history, past social history, past surgical history, and problem list.  Review of Systems  All other systems reviewed and are negative.   Past medical history, past surgical history, family history and social history were all reviewed and documented in the EPIC chart.  Exam:  Vitals:   11/09/23 1453  BP: 124/80  Pulse: 87  SpO2: 99%  Weight: 188 lb (85.3 kg)   Body mass index is 29.01 kg/m.  Physical Exam Constitutional:      Appearance: Normal appearance.  Pulmonary:     Effort: Pulmonary effort is normal.  Neurological:     Mental Status: She is alert.  Psychiatric:        Mood and Affect: Mood normal.        Thought Content: Thought content normal.        Judgment: Judgment normal.      Darice Hoit, CMA present for exam  Assessment/Plan:   1. Perimenopausal vasomotor symptoms (Primary) Risks and benefits of HRT discussed - Estradiol  - estradiol  (VIVELLE -DOT) 0.025 MG/24HR; Place 1 patch onto the skin 2 (two) times a week.  Dispense: 24 patch; Refill: 1 - progesterone  (PROMETRIUM ) 100 MG capsule; Take 1 capsule (100 mg total) by mouth at bedtime.  Dispense: 90 capsule; Refill: 1      Return in about 3 months (around 02/08/2024) for Med Follow-up.  GINETTE COZIER B WHNP-BC 3:06 PM 11/09/2023

## 2023-11-10 LAB — ESTRADIOL: Estradiol: 92 pg/mL

## 2023-11-12 ENCOUNTER — Other Ambulatory Visit (HOSPITAL_BASED_OUTPATIENT_CLINIC_OR_DEPARTMENT_OTHER): Payer: Self-pay

## 2023-11-22 ENCOUNTER — Ambulatory Visit: Admitting: Internal Medicine

## 2023-11-22 ENCOUNTER — Encounter: Payer: Self-pay | Admitting: Internal Medicine

## 2023-11-22 ENCOUNTER — Other Ambulatory Visit (HOSPITAL_COMMUNITY): Payer: Self-pay

## 2023-11-22 VITALS — BP 134/80 | HR 70 | Resp 16 | Ht 67.5 in | Wt 188.6 lb

## 2023-11-22 DIAGNOSIS — R635 Abnormal weight gain: Secondary | ICD-10-CM | POA: Diagnosis not present

## 2023-11-22 DIAGNOSIS — R739 Hyperglycemia, unspecified: Secondary | ICD-10-CM

## 2023-11-22 DIAGNOSIS — F419 Anxiety disorder, unspecified: Secondary | ICD-10-CM | POA: Diagnosis not present

## 2023-11-22 MED ORDER — ALPRAZOLAM 1 MG PO TABS
1.0000 mg | ORAL_TABLET | Freq: Two times a day (BID) | ORAL | 1 refills | Status: AC | PRN
  Filled 2023-11-22: qty 60, 30d supply, fill #0

## 2023-11-22 MED ORDER — METFORMIN HCL 500 MG PO TABS: 500.0000 mg | ORAL_TABLET | Freq: Every day | ORAL | 3 refills | Status: AC

## 2023-11-22 MED ORDER — TIRZEPATIDE-WEIGHT MANAGEMENT 2.5 MG/0.5ML ~~LOC~~ SOLN: 2.5000 mg | SUBCUTANEOUS | 5 refills | Status: AC

## 2023-11-22 NOTE — Assessment & Plan Note (Addendum)
 Worse.  BMI is 29.1; diet, exercise- discussed Heather Patterson is willing to try  Zepbound .  It is not covered by her insurance.  We can try Zepbound  vials -prescription was sent to [facility] direct pharmacy Metformin  option is discussed as well

## 2023-11-22 NOTE — Progress Notes (Signed)
 Subjective:  Patient ID: Heather Patterson, female    DOB: May 17, 1975  Age: 48 y.o. MRN: [id number]  CC: Anxiety (Needs refill of Xanax ) and Obesity (Concern about weight gain)   HPI Heather Patterson presents for hyperglycemia, wt gain Pt had one anxiety attack   Outpatient Medications Prior to Visit  Medication Sig Dispense Refill   Cholecalciferol (VITAMIN D3) 50 MCG (2000 UT) capsule Take 1 capsule (2,000 Units total) by mouth daily. 100 capsule 3   estradiol  (VIVELLE -DOT) 0.025 MG/24HR Place 1 patch onto the skin 2 (two) times a week. 24 patch 1   PARAGARD  INTRAUTERINE COPPER  IU by Intrauterine route.     progesterone  (PROMETRIUM ) 100 MG capsule Take 1 capsule (100 mg total) by mouth at bedtime. 90 capsule 1   ALPRAZolam  (XANAX ) 1 MG tablet Take 1 tablet (1 mg total) by mouth 2 (two) times daily as needed for anxiety. 60 tablet 1   No facility-administered medications prior to visit.    ROS: Review of Systems  Constitutional:  Negative for activity change, appetite change, chills, fatigue and unexpected weight change.  HENT:  Negative for congestion, mouth sores and sinus pressure.   Eyes:  Negative for visual disturbance.  Respiratory:  Negative for cough and chest tightness.   Gastrointestinal:  Negative for abdominal pain and nausea.  Genitourinary:  Negative for difficulty urinating, frequency and vaginal pain.  Musculoskeletal:  Negative for back pain and gait problem.  Skin:  Negative for pallor and rash.  Neurological:  Negative for dizziness, tremors, weakness, numbness and headaches.  Psychiatric/Behavioral:  Negative for confusion, sleep disturbance and suicidal ideas. The patient is nervous/anxious.     Objective:  BP 134/80 (BP Location: Left Arm, Patient Position: Sitting)   Pulse 70   Resp 16   Ht 5' 7.5 (1.715 m)   Wt 188 lb 9.6 oz (85.5 kg)   LMP 10/23/2023 (Exact Date)   SpO2 97%   BMI 29.10 kg/m   BP Readings from Last 3 Encounters:  11/22/23 134/80   11/09/23 124/80  01/03/23 118/82    Wt Readings from Last 3 Encounters:  11/22/23 188 lb 9.6 oz (85.5 kg)  11/09/23 188 lb (85.3 kg)  01/03/23 193 lb (87.5 kg)    Physical Exam Constitutional:      General: She is not in acute distress.    Appearance: She is well-developed.  HENT:     Head: Normocephalic.     Right Ear: External ear normal.     Left Ear: External ear normal.     Nose: Nose normal.  Eyes:     General:        Right eye: No discharge.        Left eye: No discharge.     Conjunctiva/sclera: Conjunctivae normal.     Pupils: Pupils are equal, round, and reactive to light.  Neck:     Thyroid : No thyromegaly.     Vascular: No JVD.     Trachea: No tracheal deviation.  Cardiovascular:     Rate and Rhythm: Normal rate and regular rhythm.     Heart sounds: Normal heart sounds.  Pulmonary:     Effort: No respiratory distress.     Breath sounds: No stridor. No wheezing.  Abdominal:     General: Bowel sounds are normal. There is no distension.     Palpations: Abdomen is soft. There is no mass.     Tenderness: There is no abdominal tenderness. There is no guarding or  rebound.  Musculoskeletal:        General: No tenderness.     Cervical back: Normal range of motion and neck supple. No rigidity.  Lymphadenopathy:     Cervical: No cervical adenopathy.  Skin:    Findings: No erythema or rash.  Neurological:     Mental Status: She is oriented to person, place, and time.     Cranial Nerves: No cranial nerve deficit.     Motor: No abnormal muscle tone.     Coordination: Coordination normal.     Deep Tendon Reflexes: Reflexes normal.  Psychiatric:        Behavior: Behavior normal.        Thought Content: Thought content normal.        Judgment: Judgment normal.     Lab Results  Component Value Date   WBC 9.2 01/03/2023   HGB 10.4 (L) 01/03/2023   HCT 33.3 (L) 01/03/2023   PLT 351.0 01/03/2023   GLUCOSE 93 01/03/2023   CHOL 198 01/03/2023   TRIG 121.0  01/03/2023   HDL 49.40 01/03/2023   LDLCALC 124 (H) 01/03/2023   ALT 17 01/03/2023   AST 17 01/03/2023   NA 136 01/03/2023   K 3.6 01/03/2023   CL 102 01/03/2023   CREATININE 0.82 01/03/2023   BUN 9 01/03/2023   CO2 26 01/03/2023   TSH 5.11 01/03/2023    US  THYROID  Result Date: 02/16/2022 CLINICAL DATA:  Palpable abnormality. Thyromegaly on physical examination. EXAM: THYROID  ULTRASOUND TECHNIQUE: Ultrasound examination of the thyroid  gland and adjacent soft tissues was performed. COMPARISON:  None Available. FINDINGS: Parenchymal Echotexture: Moderately heterogenous mild diffuse glandular hyperemia (images 23 and 46) Isthmus: Normal in size measuring 0.5 cm in diameter Right lobe: Borderline enlarged measuring 5.8 x 1.8 x 1.6 cm Left lobe: Borderline enlarged measuring 5.6 x 1.8 x 1.4 cm _________________________________________________________ Estimated total number of nodules >/= 1 cm: 1 Number of spongiform nodules >/=  2 cm not described below (TR1): 0 Number of mixed cystic and solid nodules >/= 1.5 cm not described below (TR2): 0 _________________________________________________________ There is an approximately 0.7 x 0.4 x 0.4 cm hypoechoic nodule adjacent to the inferior pole of the right lobe of the thyroid  (labeled 1), potentially representative of a non pathologically enlarged lymph node though conceivably a parathyroid adenoma could have a similar appearance. _________________________________________________________ There is an approximately 0.9 x 0.6 x 0.4 cm hypoechoic nodule within the inferior pole of the right lobe of the thyroid  (labeled 2), which does not meet criteria to recommend percutaneous sampling or continued dedicated follow-up. _________________________________________________________ Nodule # 3: Location: Left; Mid Maximum size: 1.6 cm; Other 2 dimensions: 1.3 x 1.0 cm Composition: solid/almost completely solid (2) Echogenicity: isoechoic (1) Shape: not taller-than-wide  (0) Margins: smooth (0) Echogenic foci: none (0) ACR TI-RADS total points: 3. ACR TI-RADS risk category: TR3 (3 points). ACR TI-RADS recommendations: *Given size (>/= 1.5 - 2.4 cm) and appearance, a follow-up ultrasound in 1 year should be considered based on TI-RADS criteria. _________________________________________________________ IMPRESSION: 1. Moderately heterogeneous and borderline enlarged thyroid  with potential diffuse glandular hyperemia, nonspecific though could be seen in the setting of a thyroiditis. Clinical correlation advised. 2. Nodule labeled #3 meets imaging criteria to recommend a 1 year follow-up as indicated. 3. Punctate (approximately 0.7 cm) hypoechoic nodule adjacent to the inferior pole of the right lobe of the thyroid  may represent a non pathologically enlarged cervical lymph node versus a parathyroid adenoma (favored). Clinical correlation is advised. Further evaluation with nuclear  medicine parathyroid scintigraphy could be performed as indicated. The above is in keeping with the ACR TI-RADS recommendations - J Am Coll Radiol 2017;14:587-595. Electronically Signed   By: Norleen Roulette M.D.   On: 02/16/2022 11:01    Assessment & Plan:   Problem List Items Addressed This Visit     Anxiety   Worse.  Continue alprazolam -rare use Prescription provided  Potential benefits of a long term benzodiazepines  use as well as potential risks  and complications were explained to the patient and were aknowledged.       Relevant Medications   ALPRAZolam  (XANAX ) 1 MG tablet   Hyperglycemia   Primarily postprandial.  Weight loss discussed.  Zepbound  vials prescription given.  Metformin  option discussed      Weight gain - Primary   Worse.  BMI is 29.1; diet, exercise- discussed Rubena is willing to try  Zepbound .  It is not covered by her insurance.  We can try Zepbound  vials -prescription was sent to [facility] direct pharmacy Metformin  option is discussed as well         Meds ordered  this encounter  Medications   metFORMIN  (GLUCOPHAGE ) 500 MG tablet    Sig: Take 1 tablet (500 mg total) by mouth daily with breakfast.    Dispense:  90 tablet    Refill:  3   ALPRAZolam  (XANAX ) 1 MG tablet    Sig: Take 1 tablet (1 mg total) by mouth 2 (two) times daily as needed for anxiety.    Dispense:  60 tablet    Refill:  1   tirzepatide  (ZEPBOUND ) 2.5 MG/0.5ML injection vial    Sig: Inject 2.5 mg into the skin once a week.    Dispense:  2 mL    Refill:  5      Follow-up: Return in about 2 months (around 01/22/2024) for Wellness Exam.  Marolyn Noel, MD

## 2023-12-02 DIAGNOSIS — R739 Hyperglycemia, unspecified: Secondary | ICD-10-CM | POA: Insufficient documentation

## 2023-12-02 DIAGNOSIS — F419 Anxiety disorder, unspecified: Secondary | ICD-10-CM | POA: Insufficient documentation

## 2023-12-02 NOTE — Assessment & Plan Note (Signed)
 Primarily postprandial.  Weight loss discussed.  Zepbound  vials prescription given.  Metformin  option discussed

## 2023-12-02 NOTE — Assessment & Plan Note (Signed)
 Worse.  Continue alprazolam -rare use Prescription provided  Potential benefits of a long term benzodiazepines  use as well as potential risks  and complications were explained to the patient and were aknowledged.

## 2023-12-07 ENCOUNTER — Ambulatory Visit: Admitting: Radiology

## 2023-12-13 ENCOUNTER — Ambulatory Visit: Admitting: Radiology

## 2024-01-15 ENCOUNTER — Ambulatory Visit: Admitting: Radiology

## 2024-03-06 ENCOUNTER — Other Ambulatory Visit (HOSPITAL_COMMUNITY): Payer: Self-pay

## 2024-03-06 ENCOUNTER — Encounter: Payer: Self-pay | Admitting: Radiology

## 2024-03-06 ENCOUNTER — Other Ambulatory Visit: Payer: Self-pay

## 2024-03-06 ENCOUNTER — Ambulatory Visit: Admitting: Radiology

## 2024-03-06 VITALS — BP 124/76 | HR 85 | Wt 167.0 lb

## 2024-03-06 DIAGNOSIS — Z113 Encounter for screening for infections with a predominantly sexual mode of transmission: Secondary | ICD-10-CM | POA: Diagnosis not present

## 2024-03-06 DIAGNOSIS — N898 Other specified noninflammatory disorders of vagina: Secondary | ICD-10-CM

## 2024-03-06 DIAGNOSIS — N951 Menopausal and female climacteric states: Secondary | ICD-10-CM | POA: Diagnosis not present

## 2024-03-06 MED ORDER — PROGESTERONE MICRONIZED 100 MG PO CAPS
100.0000 mg | ORAL_CAPSULE | Freq: Every evening | ORAL | 0 refills | Status: AC
  Filled 2024-03-06: qty 90, 90d supply, fill #0

## 2024-03-06 MED ORDER — ESTRADIOL 0.025 MG/24HR TD PTTW
1.0000 | MEDICATED_PATCH | TRANSDERMAL | 0 refills | Status: AC
  Filled 2024-03-06: qty 24, 84d supply, fill #0

## 2024-03-06 NOTE — Progress Notes (Signed)
" ° °  Heather Patterson Dec 11, 1975 [id number]   History:  48 y.o. G3P3 here for follow up after starting HRT 9/25. Feels much better, happy with current dose. She presented originally c/o hot flashes, dryness (skin, hair, vagina), trouble sleeping, increased fatigue, skipping menses, weight gain.  Would also like STI screening today, has a new partner x 1 month. Has noticed some increased discharge.  Gynecologic History Patient's last menstrual period was 02/24/2024 (exact date).   Contraception/Family planning: IUD  Obstetric History OB History  Gravida Para Term Preterm AB Living  3 3    3   SAB IAB Ectopic Multiple Live Births          # Outcome Date GA Lbr Len/2nd Weight Sex Type Anes PTL Lv  3 Para           2 Para           1 Para                The following portions of the patient's history were reviewed and updated as appropriate: allergies, current medications, past family history, past medical history, past social history, past surgical history, and problem list.  Review of Systems  All other systems reviewed and are negative.   Past medical history, past surgical history, family history and social history were all reviewed and documented in the EPIC chart.  Exam:  Vitals:   03/06/24 1442  BP: 124/76  Pulse: 85  SpO2: 99%  Weight: 167 lb (75.8 kg)   Body mass index is 25.77 kg/m.  Physical Exam Vitals and nursing note reviewed. Exam conducted with a chaperone present.  Constitutional:      Appearance: Normal appearance. She is well-developed.  Pulmonary:     Effort: Pulmonary effort is normal.  Abdominal:     General: Abdomen is flat.     Palpations: Abdomen is soft.  Genitourinary:    General: Normal vulva.     Vagina: Vaginal discharge present. No erythema, bleeding or lesions.     Cervix: Normal. No discharge, friability, lesion or erythema.     Uterus: Normal.      Adnexa: Right adnexa normal and left adnexa normal.  Neurological:     Mental Status:  She is alert.  Psychiatric:        Mood and Affect: Mood normal.        Thought Content: Thought content normal.        Judgment: Judgment normal.      Heather Patterson, CMA present for exam  Assessment/Plan:   1. Screening for STDs (sexually transmitted diseases) (Primary) - SureSwab Advanced Vaginitis Plus,TMA  2. Vaginal discharge - SureSwab Advanced Vaginitis Plus,TMA  3. Perimenopausal vasomotor symptoms - estradiol  (VIVELLE -DOT) 0.025 MG/24HR; Place 1 patch onto the skin 2 (two) times a week.  Dispense: 24 patch; Refill: 0 - progesterone  (PROMETRIUM ) 100 MG capsule; Take 1 capsule (100 mg total) by mouth at bedtime.  Dispense: 90 capsule; Refill: 0    Will contact with results AEX scheduled in 2 months.  Heather Patterson B WHNP-BC 3:00 PM 03/06/2024 "

## 2024-03-07 LAB — SURESWAB® ADVANCED VAGINITIS PLUS,TMA
C. trachomatis RNA, TMA: NOT DETECTED
CANDIDA SPECIES: NOT DETECTED
Candida glabrata: NOT DETECTED
N. gonorrhoeae RNA, TMA: NOT DETECTED
SURESWAB(R) ADV BACTERIAL VAGINOSIS(BV),TMA: NEGATIVE
TRICHOMONAS VAGINALIS (TV),TMA: NOT DETECTED

## 2024-03-10 ENCOUNTER — Ambulatory Visit: Payer: Self-pay | Admitting: Radiology

## 2024-03-13 ENCOUNTER — Other Ambulatory Visit: Payer: Self-pay | Admitting: Internal Medicine

## 2024-03-13 DIAGNOSIS — Z201 Contact with and (suspected) exposure to tuberculosis: Secondary | ICD-10-CM

## 2024-03-13 NOTE — Progress Notes (Signed)
 Needs TB test

## 2024-04-08 ENCOUNTER — Encounter: Admitting: Internal Medicine

## 2024-04-30 ENCOUNTER — Ambulatory Visit: Admitting: Radiology
